# Patient Record
Sex: Female | Born: 1985 | Race: White | Hispanic: No | Marital: Married | Smoking: Former smoker
Health system: Southern US, Community
[De-identification: ages and names within clinical notes are randomized; demographics above are authoritative.]

## PROBLEM LIST (undated history)

## (undated) ENCOUNTER — Inpatient Hospital Stay: Payer: Self-pay

## (undated) DIAGNOSIS — F909 Attention-deficit hyperactivity disorder, unspecified type: Secondary | ICD-10-CM

## (undated) HISTORY — PX: NO PAST SURGERIES: SHX2092

---

## 2004-12-12 ENCOUNTER — Ambulatory Visit: Payer: Self-pay | Admitting: Pain Medicine

## 2004-12-14 ENCOUNTER — Ambulatory Visit: Payer: Self-pay | Admitting: Pain Medicine

## 2004-12-19 ENCOUNTER — Ambulatory Visit: Payer: Self-pay | Admitting: Pain Medicine

## 2004-12-26 ENCOUNTER — Ambulatory Visit: Payer: Self-pay | Admitting: Physician Assistant

## 2007-08-17 ENCOUNTER — Ambulatory Visit: Payer: Self-pay | Admitting: Pain Medicine

## 2007-08-25 ENCOUNTER — Ambulatory Visit: Payer: Self-pay | Admitting: Pain Medicine

## 2008-10-05 ENCOUNTER — Emergency Department: Payer: Self-pay | Admitting: Internal Medicine

## 2011-11-04 DIAGNOSIS — F909 Attention-deficit hyperactivity disorder, unspecified type: Secondary | ICD-10-CM | POA: Insufficient documentation

## 2011-11-04 DIAGNOSIS — F988 Other specified behavioral and emotional disorders with onset usually occurring in childhood and adolescence: Secondary | ICD-10-CM | POA: Insufficient documentation

## 2013-03-01 ENCOUNTER — Ambulatory Visit: Payer: Self-pay | Admitting: Pain Medicine

## 2013-03-02 ENCOUNTER — Ambulatory Visit: Payer: Self-pay | Admitting: Pain Medicine

## 2013-09-10 DIAGNOSIS — IMO0002 Reserved for concepts with insufficient information to code with codable children: Secondary | ICD-10-CM | POA: Insufficient documentation

## 2013-09-10 DIAGNOSIS — Z8619 Personal history of other infectious and parasitic diseases: Secondary | ICD-10-CM | POA: Insufficient documentation

## 2014-05-13 NOTE — L&D Delivery Note (Signed)
VAGINAL DELIVERY NOTE:  Date of Delivery: 03/02/2015 Primary OB: [facility]  Gestational Age/EDD: 3278w1d 02/22/2015, by Last Menstrual Period Antepartum complications: none Attending Physician: Annamarie MajorPaul Kerah Hardebeck, MD, FACOG Delivery Type: spontaneous vaginal delivery  Anesthesia: epidural Laceration: 1st degree Episiotomy: none Placenta: spontaneous Intrapartum complications: None Estimated Blood Loss: 300 mL GBS: Neg  Baby: Liveborn female, Apgars 3/7, weight 8#, 0 oz  Annamarie MajorPaul Eliab Closson, MD

## 2014-07-29 LAB — OB RESULTS CONSOLE PLATELET COUNT: Platelets: 254 10*3/uL

## 2014-07-29 LAB — OB RESULTS CONSOLE HEPATITIS B SURFACE ANTIGEN: HEP B S AG: NEGATIVE

## 2014-07-29 LAB — OB RESULTS CONSOLE HIV ANTIBODY (ROUTINE TESTING): HIV: NONREACTIVE

## 2014-07-29 LAB — OB RESULTS CONSOLE RUBELLA ANTIBODY, IGM: Rubella: IMMUNE

## 2014-07-29 LAB — OB RESULTS CONSOLE RPR: RPR: NONREACTIVE

## 2014-07-29 LAB — OB RESULTS CONSOLE ABO/RH: RH Type: POSITIVE

## 2014-07-29 LAB — OB RESULTS CONSOLE ANTIBODY SCREEN: Antibody Screen: NEGATIVE

## 2014-07-29 LAB — OB RESULTS CONSOLE VARICELLA ZOSTER ANTIBODY, IGG: Varicella: IMMUNE

## 2014-11-29 ENCOUNTER — Observation Stay: Payer: BLUE CROSS/BLUE SHIELD

## 2014-11-29 ENCOUNTER — Observation Stay
Admission: EM | Admit: 2014-11-29 | Discharge: 2014-11-29 | Disposition: A | Discharge status: Home / Self Care | Payer: BLUE CROSS/BLUE SHIELD | Attending: Obstetrics and Gynecology | Admitting: Obstetrics and Gynecology

## 2014-11-29 DIAGNOSIS — Z3490 Encounter for supervision of normal pregnancy, unspecified, unspecified trimester: Secondary | ICD-10-CM

## 2014-11-29 DIAGNOSIS — O26892 Other specified pregnancy related conditions, second trimester: Principal | ICD-10-CM | POA: Insufficient documentation

## 2014-11-29 DIAGNOSIS — N1330 Unspecified hydronephrosis: Secondary | ICD-10-CM | POA: Insufficient documentation

## 2014-11-29 DIAGNOSIS — N200 Calculus of kidney: Secondary | ICD-10-CM | POA: Diagnosis not present

## 2014-11-29 DIAGNOSIS — Z3A27 27 weeks gestation of pregnancy: Secondary | ICD-10-CM | POA: Insufficient documentation

## 2014-11-29 DIAGNOSIS — R109 Unspecified abdominal pain: Secondary | ICD-10-CM | POA: Diagnosis present

## 2014-11-29 DIAGNOSIS — R10A1 Flank pain, right side: Secondary | ICD-10-CM | POA: Diagnosis present

## 2014-11-29 LAB — BASIC METABOLIC PANEL
Anion gap: 7 (ref 5–15)
BUN: 7 mg/dL (ref 6–20)
CALCIUM: 8.6 mg/dL — AB (ref 8.9–10.3)
CHLORIDE: 107 mmol/L (ref 101–111)
CO2: 22 mmol/L (ref 22–32)
Creatinine, Ser: 0.5 mg/dL (ref 0.44–1.00)
GFR calc Af Amer: >60 mL/min (ref >60)
GFR calc non Af Amer: >60 mL/min (ref >60)
GLUCOSE: 79 mg/dL (ref 65–99)
Potassium: 3.9 mmol/L (ref 3.5–5.1)
Sodium: 136 mmol/L (ref 135–145)

## 2014-11-29 LAB — URINALYSIS COMPLETE WITH MICROSCOPIC (ARMC ONLY)
Bilirubin Urine: NEGATIVE
Glucose, UA: NEGATIVE mg/dL
Ketones, ur: NEGATIVE mg/dL
NITRITE: NEGATIVE
PH: 5 (ref 5.0–8.0)
PROTEIN: NEGATIVE mg/dL
Specific Gravity, Urine: 1.017 (ref 1.005–1.030)

## 2014-11-29 LAB — CBC WITH DIFFERENTIAL/PLATELET
BASOS ABS: 0.1 10*3/uL (ref 0–0.1)
Basophils Relative: 1 %
Eosinophils Absolute: 0.1 10*3/uL (ref 0–0.7)
Eosinophils Relative: 1 %
HCT: 35.3 % (ref 35.0–47.0)
Hemoglobin: 12 g/dL (ref 12.0–16.0)
Lymphocytes Relative: 10 %
Lymphs Abs: 1.3 10*3/uL (ref 1.0–3.6)
MCH: 32.6 pg (ref 26.0–34.0)
MCHC: 34.1 g/dL (ref 32.0–36.0)
MCV: 95.7 fL (ref 80.0–100.0)
Monocytes Absolute: 0.7 10*3/uL (ref 0.2–0.9)
Monocytes Relative: 6 %
NEUTROS PCT: 84 %
Neutro Abs: 10.9 10*3/uL — ABNORMAL HIGH (ref 1.4–6.5)
Platelets: 167 10*3/uL (ref 150–440)
RBC: 3.69 MIL/uL — ABNORMAL LOW (ref 3.80–5.20)
RDW: 12.3 % (ref 11.5–14.5)
WBC: 13 10*3/uL — ABNORMAL HIGH (ref 3.6–11.0)

## 2014-11-29 MED ORDER — ACETAMINOPHEN-CODEINE #3 300-30 MG PO TABS
ORAL_TABLET | ORAL | Status: AC
  Administered 2014-11-29: 1 via ORAL
  Filled 2014-11-29: qty 1

## 2014-11-29 MED ORDER — ACETAMINOPHEN-CODEINE #3 300-30 MG PO TABS
1.0000 | ORAL_TABLET | Freq: Once | ORAL | Status: AC
  Administered 2014-11-29: 1 via ORAL

## 2014-11-29 MED ORDER — ACETAMINOPHEN-CODEINE #3 300-30 MG PO TABS: 1.0000 | ORAL_TABLET | ORAL | Status: DC | PRN

## 2014-11-29 NOTE — Progress Notes (Signed)
Pt to US via w/c in stable condition

## 2014-11-29 NOTE — OB Triage Note (Signed)
Pt with c/o right flank and right side pain.  Has vomitted X2  +FM, no ROM or VB.  HX of UTI, believes this might be one as she feels like she needs to void but doesn't

## 2014-11-29 NOTE — Plan of Care (Signed)
Pt instructed on how to collect and strain urine to collect kidney stone. Collection container, strainer and cup in bathroom. Pt verbalizes understanding of process

## 2014-11-29 NOTE — Progress Notes (Signed)
Pt returned from US.  EFM reapplied.

## 2014-11-29 NOTE — H&P (Signed)
Obstetric History and Physical  Carrie CornwallDana L Brown is a 29 y.o. G1P0000 with Estimated Date of Delivery: 02/22/15 per LMP and 10 wk US who presents at 2537w6d  presenting for right sided flank pain starting at 0245 this am. Pt also reported nausea and vomiting when pain was severe. Patient states she has been having no contractions, no vaginal bleeding, intact membranes, with active fetal movement.    Prenatal Course Source of Care: [facility]  with onset of care at 10 weeks Pregnancy complications or risks: Patient Active Problem List   Diagnosis Date Noted  . Pregnancy 11/29/2014  . Acute right flank pain 11/29/2014   Prenatal labs and studies: ABO, Rh: O+  Antibody: Neg Rubella: Immune Varicella: Immune RPR:  Non-reactive HBsAg:  neg HIV: neg GC/CT: unknown GBS: unknown 1 hr Glucola: not yet done   Genetic screening: 1st trimester negative    Prenatal Transfer Tool   PMHx: ADHD (off meds in pregnancy)  History reviewed. No pertinent past surgical history.  OB History  Gravida Para Term Preterm AB SAB TAB Ectopic Multiple Living  1 0 0 0 0 0 0 0 0 0     # Outcome Date GA Lbr Len/2nd Weight Sex Delivery Anes PTL Lv              1 Gravida               History   Social History  . Marital Status: Single    Spouse Name: N/A  . Number of Children: N/A  . Years of Education: N/A   Social History Main Topics  . Smoking status: Former Games developermoker  . Smokeless tobacco: Never Used  . Alcohol Use: No  . Drug Use: No  . Sexual Activity: Yes   Other Topics Concern  . None   Social History Narrative  . None      Prescriptions prior to admission  Medication Sig Dispense Refill Last Dose  . Prenatal Vit-Fe Fumarate-FA (PRENATAL MULTIVITAMIN) TABS tablet Take 1 tablet by mouth daily at 12 noon.       No Known Allergies  Review of Systems: Negative except for what is mentioned in HPI.  Physical Exam: BP 117/79 mmHg  Pulse 101  Temp(Src) 98.1 F (36.7 C) (Oral)  Resp 16   Ht 5\' 6"  (1.676 m)  Wt 190 lb (86.183 kg)  BMI 30.68 kg/m2  LMP 05/19/2014 GENERAL: Well-developed, well-nourished female in no acute distress.  LUNGS: Clear to auscultation bilaterally.  HEART: Regular rate and rhythm. ABDOMEN: right upper quadrant pain with deep palpation EXTREMITIES: Nontender, no edema Cervical Exam: deferred FHT: + Contractions: none noted    Pertinent Labs/Studies:   Results for orders placed or performed during the hospital encounter of 11/29/14 (from the past 24 hour(s))  Urinalysis complete, with microscopic ([facility] only)     Status: Abnormal   Collection Time: 11/29/14  4:16 AM  Result Value Ref Range   Color, Urine YELLOW (A) YELLOW   APPearance HAZY (A) CLEAR   Glucose, UA NEGATIVE NEGATIVE mg/dL   Bilirubin Urine NEGATIVE NEGATIVE   Ketones, ur NEGATIVE NEGATIVE mg/dL   Specific Gravity, Urine 1.017 1.005 - 1.030   Hgb urine dipstick 2+ (A) NEGATIVE   pH 5.0 5.0 - 8.0   Protein, ur NEGATIVE NEGATIVE mg/dL   Nitrite NEGATIVE NEGATIVE   Leukocytes, UA TRACE (A) NEGATIVE   RBC / HPF TOO NUMEROUS TO COUNT 0 - 5 RBC/hpf   WBC, UA 0-5 0 - 5 WBC/hpf  Bacteria, UA FEW (A) NONE SEEN   Squamous Epithelial / LPF 0-5 (A) NONE SEEN   Mucous PRESENT    Renal US showing 5 mm non-obstructing stone at the upper pole of right kidney, mild right sided hydronephrosis. Left kidney normal.   Assessment : IUP at [redacted]w[redacted]d   Plan: CMP and Urine culture ordered  Tylenol #3 for pain prn Consider Urology consult prn Strain urine

## 2014-11-29 NOTE — Progress Notes (Signed)
Report given to T. Brothers, CNM.  EFM off at this time.

## 2014-11-30 LAB — URINE CULTURE
Culture: NO GROWTH
Special Requests: NORMAL

## 2014-12-07 NOTE — Progress Notes (Signed)
S: " I feel a whole lot better. I am hungry. OK if I eat."  O: BP 117/79 mmHg  Pulse 101  Temp(Src) 98.1 F (36.7 C) (Oral)  Resp 16  Ht  (1.676 m)  Wt 190 lb (86.183 kg)  BMI 30.68 kg/m2  LMP 05/18/2014  General: looks comfortable in NAD Consulted Dr Ronne Binning from [facility] and reviewed ultrasound findings and plan of management with him.  A/P: Patient may have passed a stone; Dr Ronne Binning doubts that the stone seen on ultrasound was causing the pain, which is now resolved. Will discharge the patient on Flomax 0.4 mgm daily #30 and Tylenol #3 #30 and follow up with him on 27 July at his office on [address]. Will have her continue to strain her urine and drink plenty of fluids RTN to [facility] as scheduled and prn.  Farrel Conners, CNM   Late note written 07 December 2014.

## 2014-12-16 ENCOUNTER — Encounter: Payer: Self-pay | Admitting: Urology

## 2014-12-16 ENCOUNTER — Ambulatory Visit (INDEPENDENT_AMBULATORY_CARE_PROVIDER_SITE_OTHER): Payer: BLUE CROSS/BLUE SHIELD | Admitting: Urology

## 2014-12-16 VITALS — BP 98/67 | HR 111 | Ht 66.0 in | Wt 193.5 lb

## 2014-12-16 DIAGNOSIS — N1330 Unspecified hydronephrosis: Secondary | ICD-10-CM | POA: Diagnosis not present

## 2014-12-16 DIAGNOSIS — N200 Calculus of kidney: Secondary | ICD-10-CM

## 2014-12-16 LAB — URINALYSIS, COMPLETE
Bilirubin, UA: NEGATIVE
Glucose, UA: NEGATIVE
KETONES UA: NEGATIVE
NITRITE UA: NEGATIVE
PH UA: 7 (ref 5.0–7.5)
RBC, UA: NEGATIVE
Specific Gravity, UA: 1.025 (ref 1.005–1.030)
Urobilinogen, Ur: 1 mg/dL (ref 0.2–1.0)

## 2014-12-16 LAB — MICROSCOPIC EXAMINATION
RBC, UA: NONE SEEN /hpf (ref 0–?)
Renal Epithel, UA: NONE SEEN /hpf

## 2014-12-16 NOTE — Progress Notes (Signed)
12/16/2014 5:27 PM   Carrie Brown [birth year] [id number]  Referring provider: No referring provider defined for this encounter.  Chief Complaint  Patient presents with  . Flank Pain    f/u ER kidney stones, x 2-3 weeks ago     HPI: 29 yo F with currently [redacted] weeks pregnant with right acute onset right flank pain.  She was seen at L&D triage dx with kidney stone ~3 weeks ago. Renal ultrasound on 11/27/14 showed a 5 mm stone in the right upper pole with minimal right hydronephrosis (physiologic versus stone).  She was was discharged after her pain resolved several hours later.    After discharge, she did feel like she had a possible UTI for a few days but this ultimately resolved too.  He does endorse drinking plenty of water to help improve her symptoms.  No associated fevers or chills.  No n/v.    She denies a personal history of stones.    She does endorse history of fairly fequent UTIs but not more than 1 annually.  She has had a urinary tract infection during this pregnancy. Today she denies any urinary tract infection symptoms.  Does have some urinary frequency and urgency related to the pregnancy but no dysuria.  PMH: History reviewed. No pertinent past medical history.  Surgical History: History reviewed. No pertinent past surgical history.  Home Medications:    Medication List       This list is accurate as of: 12/16/14  5:27 PM.  Always use your most recent med list.               acetaminophen-codeine 300-30 MG per tablet  Commonly known as:  TYLENOL #3  TK 1 TO 2 TS PO  Q 4 TO 6 H PRN P     prenatal multivitamin Tabs tablet  Take 1 tablet by mouth daily at 12 noon.     tamsulosin 0.4 MG Caps capsule  Commonly known as:  FLOMAX  TK ONE C PO  QD        Allergies:  Allergies  Allergen Reactions  . Lisdexamfetamine Dimesylate Anxiety    emotional    Family History: History reviewed. No pertinent family history.  Social History:  reports that she  has quit smoking. She has never used smokeless tobacco. She reports that she does not drink alcohol or use illicit drugs.  ROS: UROLOGY Frequent Urination?: No Hard to postpone urination?: No Burning/pain with urination?: Yes Get up at night to urinate?: No Leakage of urine?: No Urine stream starts and stops?: No Trouble starting stream?: No Do you have to strain to urinate?: No Blood in urine?: No Urinary tract infection?: Yes Sexually transmitted disease?: No Injury to kidneys or bladder?: No Painful intercourse?: No Weak stream?: No Currently pregnant?: No Vaginal bleeding?: No Last menstrual period?: n  Gastrointestinal Nausea?: No Vomiting?: No Indigestion/heartburn?: No Diarrhea?: No Constipation?: No  Constitutional Fever: No Night sweats?: No Weight loss?: No Fatigue?: No  Skin Skin rash/lesions?: No Itching?: No  Eyes Blurred vision?: No Double vision?: No  Ears/Nose/Throat Sore throat?: No Sinus problems?: No  Hematologic/Lymphatic Swollen glands?: No Easy bruising?: No  Cardiovascular Leg swelling?: Yes (related to pregancy) Chest pain?: No  Respiratory Cough?: No Shortness of breath?: No  Endocrine Excessive thirst?: No  Musculoskeletal Back pain?: Yes Joint pain?: No  Neurological Headaches?: Yes Dizziness?: No  Psychologic Depression?: No Anxiety?: No  Physical Exam: BP 98/67 mmHg  Pulse 111  Ht 5\' 6"  (  1.676 m)  Wt 193 lb 8 oz (87.771 kg)  BMI 31.25 kg/m2  LMP 05/18/2014  Constitutional:  Alert and oriented, No acute distress. HEENT: [place] AT, moist mucus membranes.  Trachea midline, no masses. Cardiovascular: No clubbing, cyanosis, or edema. Respiratory: Normal respiratory effort, no increased work of breathing. GI: Abdomen is soft, nontender, nondistended, no abdominal masses.  Gravid ureterus.  GU: No CVA tenderness.  Skin: No rashes, bruises or suspicious lesions. Lymph: No cervical or inguinal  adenopathy. Neurologic: Grossly intact, no focal deficits, moving all 4 extremities. Psychiatric: Normal mood and affect.  Laboratory Data: Lab Results  Component Value Date   WBC 13.0* 11/29/2014   HGB 12.0 11/29/2014   HCT 35.3 11/29/2014   MCV 95.7 11/29/2014   PLT 167 11/29/2014    Lab Results  Component Value Date   CREATININE 0.50 11/29/2014    Urinalysis Results for orders placed or performed in visit on 12/16/14  Microscopic Examination  Result Value Ref Range   WBC, UA 11-30 (A) 0 -  5 /hpf   RBC, UA None seen 0 -  2 /hpf   Epithelial Cells (non renal) >10 (A) 0 - 10 /hpf   Renal Epithel, UA None seen None seen /hpf   Mucus, UA Present (A) Not Estab.   Bacteria, UA Many (A) None seen/Few  Urinalysis, Complete  Result Value Ref Range   Specific Gravity, UA 1.025 1.005 - 1.030   pH, UA 7.0 5.0 - 7.5   Color, UA Yellow Yellow   Appearance Ur Clear Clear   Leukocytes, UA Trace (A) Negative   Protein, UA Trace (A) Negative/Trace   Glucose, UA Negative Negative   Ketones, UA Negative Negative   RBC, UA Negative Negative   Bilirubin, UA Negative Negative   Urobilinogen, Ur 1.0 0.2 - 1.0 mg/dL   Nitrite, UA Negative Negative   Microscopic Examination See below:     Pertinent Imaging:  CLINICAL DATA: Acute onset of right flank pain. Initial encounter.  EXAM: RENAL / URINARY TRACT ULTRASOUND COMPLETE  COMPARISON: None.  FINDINGS: Right Kidney:  Length: 12.3 cm. Echogenicity within normal limits. There is suggestion of minimal right-sided hydronephrosis. A suspected 5 mm stone is noted at the upper pole of the right kidney.  Left Kidney:  Length: 11.6 cm. Echogenicity within normal limits. No mass or hydronephrosis visualized.  Bladder:  Decompressed and not well assessed.  IMPRESSION: 1. [place]  CLINICAL DATA: Acute onset of right flank pain. Initial encounter.  EXAM: RENAL / URINARY TRACT ULTRASOUND COMPLETE  COMPARISON:  None.  FINDINGS: Right Kidney:  Length: 12.3 cm. Echogenicity within normal limits. There is suggestion of minimal right-sided hydronephrosis. A suspected 5 mm stone is noted at the upper pole of the right kidney.  Left Kidney:  Length: 11.6 cm. Echogenicity within normal limits. No mass or hydronephrosis visualized.  Bladder:  Decompressed and not well assessed.  IMPRESSION: 1. Suggestion of minimal right-sided hydronephrosis, likely reflecting hydronephrosis of pregnancy. 2. Suspect 5 mm nonobstructing stone at the upper pole of the right kidney.   Electronically Signed  By: Roanna Raider M.D.  On: 11/29/2014 05:58    stion of minimal right-sided hydronephrosis, likely reflecting hydronephrosis of pregnancy. 2. Suspect 5 mm nonobstructing stone at the upper pole of the right kidney.   Electronically Signed  By: Roanna Raider M.D.  On: 11/29/2014 05:58     Assessment & Plan:  29 yo F currently [redacted] weeks pregnant with isolated episode of flank pain, 5 mm right  nonobstructing stone. Currently asymptomatic. Recommended no further intervention at this time given her stage of pregnancy, however, do recommend that she follow up if she develops any further episodes of flank pain, urinary tract symptoms, or any other concerning signs or symptoms.  Recommend follow-up after delivery of her child with a KUB to assess her overall stone burden. We discussed that depending on the size and locations of the stone, they may or may not need treatment in the future to prevent future episodes of renal colic. Patient is agreeable with this plan.  1. Renal stones As above - Urinalysis, Complete - DG Abd 1 View; Future  2. Hydronephrosis, right [place] physiologic upon review related to pregnancy.   Return in about 4 months (around 04/17/2015) for f/u KUB.  Vanna Scotland, MD  [facility] [address], [address] [place], [place]  [phone]

## 2015-03-01 ENCOUNTER — Encounter: Payer: Self-pay | Admitting: *Deleted

## 2015-03-01 ENCOUNTER — Inpatient Hospital Stay
Admission: EM | Admit: 2015-03-01 | Discharge: 2015-03-04 | DRG: 775 | Disposition: A | Discharge status: Home / Self Care | Payer: BLUE CROSS/BLUE SHIELD | Attending: Obstetrics & Gynecology | Admitting: Obstetrics & Gynecology

## 2015-03-01 DIAGNOSIS — Z79899 Other long term (current) drug therapy: Secondary | ICD-10-CM

## 2015-03-01 DIAGNOSIS — Z3A41 41 weeks gestation of pregnancy: Secondary | ICD-10-CM

## 2015-03-01 DIAGNOSIS — Z888 Allergy status to other drugs, medicaments and biological substances status: Secondary | ICD-10-CM

## 2015-03-01 DIAGNOSIS — O480 Post-term pregnancy: Principal | ICD-10-CM | POA: Diagnosis present

## 2015-03-01 HISTORY — DX: Attention-deficit hyperactivity disorder, unspecified type: F90.9

## 2015-03-01 LAB — CBC
HEMATOCRIT: 35.6 % (ref 35.0–47.0)
HEMOGLOBIN: 11.9 g/dL — AB (ref 12.0–16.0)
MCH: 30.4 pg (ref 26.0–34.0)
MCHC: 33.4 g/dL (ref 32.0–36.0)
MCV: 91.1 fL (ref 80.0–100.0)
Platelets: 175 10*3/uL (ref 150–440)
RBC: 3.9 MIL/uL (ref 3.80–5.20)
RDW: 13.7 % (ref 11.5–14.5)
WBC: 12.2 10*3/uL — AB (ref 3.6–11.0)

## 2015-03-01 MED ORDER — OXYTOCIN 40 UNITS IN LACTATED RINGERS INFUSION - SIMPLE MED
1.0000 mL/h | INTRAVENOUS | Status: DC
  Administered 2015-03-01: 1 m[IU]/min via INTRAVENOUS

## 2015-03-01 MED ORDER — TERBUTALINE SULFATE 1 MG/ML IJ SOLN: 0.2500 mg | Freq: Once | INTRAMUSCULAR | Status: DC | PRN

## 2015-03-01 MED ORDER — LACTATED RINGERS IV SOLN
INTRAVENOUS | Status: DC
  Administered 2015-03-01 – 2015-03-02 (×4): via INTRAVENOUS

## 2015-03-01 MED ORDER — OXYTOCIN 40 UNITS IN LACTATED RINGERS INFUSION - SIMPLE MED: 1.0000 mL/h | INTRAVENOUS | Status: DC

## 2015-03-01 MED ORDER — LIDOCAINE HCL (PF) 1 % IJ SOLN
30.0000 mL | INTRAMUSCULAR | Status: DC | PRN
  Filled 2015-03-01: qty 30

## 2015-03-01 MED ORDER — CITRIC ACID-SODIUM CITRATE 334-500 MG/5ML PO SOLN: 30.0000 mL | ORAL | Status: DC | PRN

## 2015-03-01 MED ORDER — OXYTOCIN 40 UNITS IN LACTATED RINGERS INFUSION - SIMPLE MED
INTRAVENOUS | Status: AC
  Administered 2015-03-01: 1 m[IU]/min via INTRAVENOUS
  Filled 2015-03-01: qty 1000

## 2015-03-01 MED ORDER — OXYTOCIN 40 UNITS IN LACTATED RINGERS INFUSION - SIMPLE MED
62.5000 mL/h | INTRAVENOUS | Status: DC
Start: 2015-03-01 — End: 2015-03-02

## 2015-03-01 MED ORDER — ONDANSETRON HCL 4 MG/2ML IJ SOLN: 4.0000 mg | Freq: Four times a day (QID) | INTRAMUSCULAR | Status: DC | PRN

## 2015-03-01 MED ORDER — LACTATED RINGERS IV SOLN: 500.0000 mL | INTRAVENOUS | Status: DC | PRN

## 2015-03-01 MED ORDER — OXYTOCIN 10 UNIT/ML IJ SOLN: 10.0000 [IU] | Freq: Once | INTRAMUSCULAR | Status: DC

## 2015-03-01 MED ORDER — OXYTOCIN 40 UNITS IN LACTATED RINGERS INFUSION - SIMPLE MED: 1.0000 m[IU]/min | INTRAVENOUS | Status: DC

## 2015-03-01 MED ORDER — OXYTOCIN BOLUS FROM INFUSION
500.0000 mL | INTRAVENOUS | Status: DC
Start: 2015-03-01 — End: 2015-03-02
  Administered 2015-03-02: 500 mL via INTRAVENOUS

## 2015-03-01 NOTE — H&P (Signed)
OB History & Physical   History of Present Illness:  Chief Complaint: presents for induction of labor  HPI:  Orvilla CornwallDana L Brucks is a 29 y.o. G1P0000 female at 4522w0d dated by LMP consistent with a 11 week ultrasound.  Her pregnancy has been uncomplicated.    She reports occasional contractions.   She denies leakage of fluid.   She denies vaginal bleeding.   She reports fetal movement.    Maternal Medical History:   Past Medical History  Diagnosis Date  . ADHD (attention deficit hyperactivity disorder)     Past Surgical History  Procedure Laterality Date  . No past surgeries      Allergies  Allergen Reactions  . Lisdexamfetamine Dimesylate Anxiety    emotional    Prior to Admission medications   Medication Sig Start Date End Date Taking? Authorizing Provider  Prenatal Vit-Fe Fumarate-FA (PRENATAL MULTIVITAMIN) TABS tablet Take 1 tablet by mouth daily at 12 noon.   Yes Historical Provider, MD   OB History  Gravida Para Term Preterm AB SAB TAB Ectopic Multiple Living  1 0 0 0 0 0 0 0 0 0     # Outcome Date GA Lbr Len/2nd Weight Sex Delivery Anes PTL Lv  1 Current               Prenatal care site: [facility]  Social History: She  reports that she has never smoked. She has never used smokeless tobacco. She reports that she does not drink alcohol or use illicit drugs.  Family History: family history is not on file.   Review of Systems: Negative x 10 systems reviewed except as noted in the HPI.    Physical Exam:  Vital Signs:  General: no acute distress.  HEENT: normocephalic, atraumatic Heart: regular rate & rhythm.  No murmurs/rubs/gallops Lungs: clear to auscultation bilaterally Abdomen: soft, gravid, non-tender;  EFW: 8 pounds Pelvic:   Cervix: Dilation: 3.5 / Effacement (%): 70, 80 (75) / Station: -2 , per RN Extremities: non-tender, symmetric, no edema bilaterally.  DTRs: 2+  Neurologic: Alert & oriented x 3.    Pertinent Results:  Prenatal Labs: Blood  type/Rh O positive  Antibody screen negative  Rubella Immune  RPR NR  HBsAg negative  HIV negative  GC negative  Chlamydia negative  Genetic screening First trimester screen negative  1 hour GTT 103  3 hour GTT n/a  GBS negative on 01/27/15   Baseline FHR: 130 beats/min   Variability: moderate   Accelerations: present   Decelerations: absent Contractions: present frequency: 2 q 10 min Overall assessment: category 1  Assessment:  Orvilla CornwallDana L Haughey is a 29 y.o. 641P0000 female at 4122w0d with an uncomplicated pregnancy who presents for induction of labor for post-dates.   Plan:  1. Admit to Labor & Delivery  2. CBC, T&S, Clrs, IVF 3. GBS negative.   4. Fetwal well-being: reassuring. 5. For induction will start pitocin given cervical dilation. Patient agrees to plan.   Conard NovakJackson, Ralphine Hinks D, MD 03/01/2015 10:56 PM

## 2015-03-02 ENCOUNTER — Inpatient Hospital Stay: Payer: BLUE CROSS/BLUE SHIELD | Admitting: Registered Nurse

## 2015-03-02 LAB — TYPE AND SCREEN
ABO/RH(D): O POS
ANTIBODY SCREEN: NEGATIVE

## 2015-03-02 LAB — RAPID HIV SCREEN (HIV 1/2 AB+AG)
HIV 1/2 Antibodies: NONREACTIVE
HIV-1 P24 Antigen - HIV24: NONREACTIVE

## 2015-03-02 LAB — ABO/RH: ABO/RH(D): O POS

## 2015-03-02 MED ORDER — DIPHENHYDRAMINE HCL 25 MG PO CAPS: 25.0000 mg | ORAL_CAPSULE | Freq: Four times a day (QID) | ORAL | Status: DC | PRN

## 2015-03-02 MED ORDER — LIDOCAINE HCL (PF) 1 % IJ SOLN
INTRAMUSCULAR | Status: AC
  Filled 2015-03-02: qty 30

## 2015-03-02 MED ORDER — FENTANYL 2.5 MCG/ML W/ROPIVACAINE 0.2% IN NS 100 ML EPIDURAL INFUSION (ARMC-ANES)
EPIDURAL | Status: AC
  Administered 2015-03-02: 10 mL/h via EPIDURAL
  Administered 2015-03-02: 8 mL/h
  Filled 2015-03-02: qty 100

## 2015-03-02 MED ORDER — OXYTOCIN 40 UNITS IN LACTATED RINGERS INFUSION - SIMPLE MED: 62.5000 mL/h | INTRAVENOUS | Status: DC | PRN

## 2015-03-02 MED ORDER — HYDROMORPHONE HCL 1 MG/ML IJ SOLN
1.0000 mg | INTRAMUSCULAR | Status: AC
  Administered 2015-03-02: 1 mg via INTRAVENOUS
  Filled 2015-03-02: qty 1

## 2015-03-02 MED ORDER — LIDOCAINE-EPINEPHRINE (PF) 1.5 %-1:200000 IJ SOLN
INTRAMUSCULAR | Status: DC | PRN
  Administered 2015-03-02: 3 mL via EPIDURAL

## 2015-03-02 MED ORDER — ACETAMINOPHEN 325 MG PO TABS: 650.0000 mg | ORAL_TABLET | ORAL | Status: DC | PRN

## 2015-03-02 MED ORDER — BENZOCAINE-MENTHOL 20-0.5 % EX AERO
1.0000 "application " | INHALATION_SPRAY | CUTANEOUS | Status: DC | PRN
  Administered 2015-03-02: 1 via TOPICAL
  Filled 2015-03-02: qty 56

## 2015-03-02 MED ORDER — IBUPROFEN 600 MG PO TABS
600.0000 mg | ORAL_TABLET | Freq: Four times a day (QID) | ORAL | Status: DC
  Administered 2015-03-02 – 2015-03-03 (×3): 600 mg via ORAL
  Filled 2015-03-02 (×5): qty 1

## 2015-03-02 MED ORDER — SODIUM CHLORIDE 0.9 % IV SOLN
250.0000 mL | INTRAVENOUS | Status: DC | PRN
Start: 2015-03-02 — End: 2015-03-04

## 2015-03-02 MED ORDER — LANOLIN HYDROUS EX OINT: TOPICAL_OINTMENT | CUTANEOUS | Status: DC | PRN

## 2015-03-02 MED ORDER — MISOPROSTOL 200 MCG PO TABS
ORAL_TABLET | ORAL | Status: AC
  Filled 2015-03-02: qty 4

## 2015-03-02 MED ORDER — FENTANYL CITRATE (PF) 100 MCG/2ML IJ SOLN
INTRAMUSCULAR | Status: AC
  Administered 2015-03-02: 50 ug via INTRAVENOUS
  Filled 2015-03-02: qty 2

## 2015-03-02 MED ORDER — OXYCODONE-ACETAMINOPHEN 5-325 MG PO TABS
2.0000 | ORAL_TABLET | ORAL | Status: DC | PRN
Start: 2015-03-02 — End: 2015-03-04

## 2015-03-02 MED ORDER — OXYTOCIN 10 UNIT/ML IJ SOLN
INTRAMUSCULAR | Status: DC
Start: 2015-03-02 — End: 2015-03-02
  Filled 2015-03-02: qty 2

## 2015-03-02 MED ORDER — SIMETHICONE 80 MG PO CHEW: 80.0000 mg | CHEWABLE_TABLET | ORAL | Status: DC | PRN

## 2015-03-02 MED ORDER — ONDANSETRON HCL 4 MG PO TABS: 4.0000 mg | ORAL_TABLET | ORAL | Status: DC | PRN

## 2015-03-02 MED ORDER — WITCH HAZEL-GLYCERIN EX PADS: 1.0000 "application " | MEDICATED_PAD | CUTANEOUS | Status: DC | PRN

## 2015-03-02 MED ORDER — OXYCODONE-ACETAMINOPHEN 5-325 MG PO TABS
1.0000 | ORAL_TABLET | ORAL | Status: DC | PRN
  Administered 2015-03-03: 1 via ORAL
  Filled 2015-03-02: qty 1

## 2015-03-02 MED ORDER — ZOLPIDEM TARTRATE 5 MG PO TABS: 5.0000 mg | ORAL_TABLET | Freq: Every evening | ORAL | Status: DC | PRN

## 2015-03-02 MED ORDER — FENTANYL CITRATE (PF) 100 MCG/2ML IJ SOLN
50.0000 ug | Freq: Once | INTRAMUSCULAR | Status: AC
  Administered 2015-03-02: 50 ug via INTRAVENOUS

## 2015-03-02 MED ORDER — SENNOSIDES-DOCUSATE SODIUM 8.6-50 MG PO TABS
2.0000 | ORAL_TABLET | ORAL | Status: DC
  Administered 2015-03-03 (×2): 2 via ORAL
  Filled 2015-03-02 (×3): qty 2

## 2015-03-02 MED ORDER — AMMONIA AROMATIC IN INHA
RESPIRATORY_TRACT | Status: AC
  Filled 2015-03-02: qty 10

## 2015-03-02 MED ORDER — BUPIVACAINE HCL (PF) 0.25 % IJ SOLN
INTRAMUSCULAR | Status: DC | PRN
  Administered 2015-03-02 (×2): 4 mL via EPIDURAL

## 2015-03-02 MED ORDER — ONDANSETRON HCL 4 MG/2ML IJ SOLN: 4.0000 mg | INTRAMUSCULAR | Status: DC | PRN

## 2015-03-02 MED ORDER — SODIUM CHLORIDE 0.9 % IJ SOLN: 3.0000 mL | Freq: Two times a day (BID) | INTRAMUSCULAR | Status: DC

## 2015-03-02 MED ORDER — SODIUM CHLORIDE 0.9 % IJ SOLN: 3.0000 mL | INTRAMUSCULAR | Status: DC | PRN

## 2015-03-02 MED ORDER — DIBUCAINE 1 % RE OINT: 1.0000 "application " | TOPICAL_OINTMENT | RECTAL | Status: DC | PRN

## 2015-03-02 NOTE — Discharge Summary (Signed)
Obstetrical Discharge Summary  Date of Admission: 03/01/2015 Date of Discharge: 03/04/2015 Discharge Diagnosis: Term Pregnancy-delivered Primary OB:  [facility]   Gestational Age at Delivery: 636w1d  Antepartum complications: none Date of Delivery: 03/02/15  Delivered By: Tiburcio PeaHarris Delivery Type: spontaneous vaginal delivery Intrapartum complications/course: None Anesthesia: epidural Placenta: spontaneous Laceration: 1st degree Episiotomy: none Live born Female  Birth Weight: 8 lb (3629 g) APGAR: 3, 7   Post partum course: Since the delivery, patient has tolerate activity, diet, and daily functions without difficulty or complication.  Min lochia.  No breast concerns at this time.  No signs of depression currently.    Disposition: home with infant Rh Immune globulin given: not applicable Rubella vaccine given: not applicable Varicella vaccine given: not applicable Tdap vaccine given in AP or PP setting: yes Flu vaccine given in AP or PP setting: yes Contraception: to be determined at post partum visit  Prenatal Labs: O POS / Rubella Immune / Varicella Immune/  RPR negative / HIV negative / HepBsAg negative / Tdap UTD: Yes/pap neg 2016/ Breast   / Follow up: [facility]   Plan:  Carrie Brown was discharged to home in good condition. [facility]  Discharge Medications:   Medication List    STOP taking these medications        acetaminophen-codeine 300-30 MG tablet  Commonly known as:  TYLENOL #3      TAKE these medications        diphenhydrAMINE 25 MG tablet  Commonly known as:  BENADRYL  Take 25 mg by mouth every 6 (six) hours as needed for sleep.     docusate sodium 100 MG capsule  Commonly known as:  COLACE  Take 1 capsule (100 mg total) by mouth 2 (two) times daily.     ibuprofen 600 MG tablet  Commonly known as:  ADVIL,MOTRIN  Take 1 tablet (600 mg total) by mouth every 6 (six) hours as needed.      oxyCODONE-acetaminophen 5-325 MG tablet  Commonly known as:  PERCOCET/ROXICET  Take 1 tablet by mouth every 6 (six) hours as needed (for pain scale 4-7).     prenatal multivitamin Tabs tablet  Take 1 tablet by mouth daily at 12 noon.     tamsulosin 0.4 MG Caps capsule  Commonly known as:  FLOMAX  TK ONE C PO  QD        Follow-up arrangements:  4wk PP visit   Future Appointments Date Time Provider Department Center  04/17/2015 9:00 AM Harle BattiestShannon A McGowan, PA-C [facility] None   Cornelia Copaharlie Oryn Casanova, Jr MD [facility]  Pager: [phone]

## 2015-03-02 NOTE — Progress Notes (Addendum)
L&D Progress Note   S: Feeling much better after epidural.   O: BP 113/57 mmHg  Pulse 95  Temp(Src) 97.6 F (36.4 C) (Oral)  Resp 16  Ht 5\' 6"  (1.676 m)  Wt 190 lb (86.183 kg)  BMI 30.68 kg/m2  SpO2 98%  LMP 05/18/2014  Undergoing Pitocin induction for postdates. Was on 19 miu/min Pitocin when SROM occurred at 0545 for clear fluid. Pitocin discontinued at that time until epidural could be obtained.  FHR 120 with mod variability and accels with scalp stimulation.  Contractions: 3 in 10 min Cervix: 5/90%/-1/LOA  A: IUP at 41.1 weeks with IOL underway Reassuring FHR  P: IUPC inserted-inadequate contractions Restart Pitocin at 2 miu/min and increase by 261miu/min Titrate to 200 mvu  Carrie Brown, CNM

## 2015-03-02 NOTE — Lactation Note (Signed)
Lactation Consultation Note  Patient Name: Carrie Brown BJYNW'GToday's Date: 03/02/2015     Maternal Data  Mother only wants to pump. Education on how to pump including cleaning the parts of the pump and storing the milk given. Mother will feed formula now and pump at a later feeding. I have expained that the earlier she pumps the better her supply will be. Feeding  Pumped milk and formula                       Lactation Tools Discussed/Used   Breast Pump  Consult Status  Ongoing    Carrie Brown 03/02/2015, 5:41 PM

## 2015-03-02 NOTE — Anesthesia Procedure Notes (Signed)
Epidural Patient location during procedure: OB Start time: 03/02/2015 8:00 AM End time: 03/02/2015 8:21 AM  Staffing Resident/CRNA: Stormy FabianURTIS, Kinslea Frances Performed by: resident/CRNA   Preanesthetic Checklist Completed: patient identified, site marked, surgical consent, pre-op evaluation, timeout performed, IV checked, risks and benefits discussed and monitors and equipment checked  Epidural Patient position: sitting Prep: Betadine Patient monitoring: heart rate, continuous pulse ox and blood pressure Approach: midline Location: L4-L5 Injection technique: LOR air  Needle:  Needle type: Tuohy  Needle gauge: 18 G Needle length: 9 cm and 9 Needle insertion depth: 6 cm Catheter type: closed end flexible Catheter size: 20 Guage Catheter at skin depth: 10 cm Test dose: negative and 1.5% lidocaine with Epi 1:200 K  Assessment Sensory level: T10 Events: blood not aspirated, injection not painful, no injection resistance, negative IV test and no paresthesia  Additional Notes Pt's history reviewed and consent obtained as per OB consent Patient tolerated the insertion well without complications. Negative SATD, negative IVTD All VSS were obtained and monitored through OBIX and nursing protocols followed.Reason for block:procedure for pain

## 2015-03-02 NOTE — Progress Notes (Signed)
L&D Progress Note  S: Feeling min pain.   O: BP 96/59 mmHg  Pulse 90  Temp(Src) 98.1 F (36.7 C) (Oral)  Resp 16  Ht 5\' 6"  (1.676 m)  Wt 190 lb (86.183 kg)  BMI 30.68 kg/m2  SpO2 98%  LMP 05/18/2014  Undergoing Pitocin induction for postdates. Current 12  miu/min Pitocin FHR 150 with mod variability and accels and occas early decel.  Contractions: q2-4 min; mVu 110-175. Cervix: 9.5/100%/-2/LOA  A: IUP at 41.1 weeks with IOL underway Reassuring FHR  P: Pitocin at 12 miu/min Second stage soon Epidural, adjust to facilitate pushing  Letitia LibraHARRIS,Wanda Rideout PAUL, MD

## 2015-03-02 NOTE — Anesthesia Preprocedure Evaluation (Signed)
Anesthesia Evaluation  Patient identified by MRN, date of birth, ID band  Reviewed: Allergy & Precautions, H&P , NPO status , Patient's Chart, lab work & pertinent test results  History of Anesthesia Complications Negative for: history of anesthetic complications  Airway Mallampati: II  TM Distance: >3 FB Neck ROM: full    Dental no notable dental hx.    Pulmonary neg pulmonary ROS,    Pulmonary exam normal        Cardiovascular negative cardio ROS Normal cardiovascular exam     Neuro/Psych PSYCHIATRIC DISORDERS ADDnegative neurological ROS     GI/Hepatic negative GI ROS, Neg liver ROS,   Endo/Other  negative endocrine ROS  Renal/GU negative Renal ROS  negative genitourinary   Musculoskeletal   Abdominal   Peds  Hematology negative hematology ROS (+)   Anesthesia Other Findings   Reproductive/Obstetrics (+) Pregnancy                             Anesthesia Physical Anesthesia Plan  ASA: II  Anesthesia Plan: Epidural   Post-op Pain Management:    Induction:   Airway Management Planned:   Additional Equipment:   Intra-op Plan:   Post-operative Plan:   Informed Consent: I have reviewed the patients History and Physical, chart, labs and discussed the procedure including the risks, benefits and alternatives for the proposed anesthesia with the patient or authorized representative who has indicated his/her understanding and acceptance.     Plan Discussed with: Anesthesiologist  Anesthesia Plan Comments:         Anesthesia Quick Evaluation

## 2015-03-03 LAB — CBC
HCT: 32.5 % — ABNORMAL LOW (ref 35.0–47.0)
Hemoglobin: 11.3 g/dL — ABNORMAL LOW (ref 12.0–16.0)
MCH: 31.7 pg (ref 26.0–34.0)
MCHC: 34.6 g/dL (ref 32.0–36.0)
MCV: 91.7 fL (ref 80.0–100.0)
PLATELETS: 126 10*3/uL — AB (ref 150–440)
RBC: 3.55 MIL/uL — ABNORMAL LOW (ref 3.80–5.20)
RDW: 13.9 % (ref 11.5–14.5)
WBC: 15.5 10*3/uL — ABNORMAL HIGH (ref 3.6–11.0)

## 2015-03-03 LAB — RPR: RPR Ser Ql: NONREACTIVE

## 2015-03-03 NOTE — Progress Notes (Signed)
Post Partum Day 1 Subjective: Doing well, no complaints.  Tolerating regular diet, pain with PO meds, voiding and ambulating without difficulty.  No CP SOB F/C N/V or leg pain   Objective: BP 116/71 mmHg  Pulse 105  Temp(Src) 97.9 F (36.6 C) (Oral)  Resp 18  SpO2 98%   Physical Exam:  General: NAD CV: RRR Pulm: nl effort, CTABL Lochia: moderate Uterine Fundus: fundus firm and below umbilicus DVT Evaluation: no cords, ttp LEs    Recent Labs  03/01/15 2258 03/03/15 0615  HGB 11.9* 11.3*  HCT 35.6 32.5*  WBC 12.2* 15.5*  PLT 175 126*    Assessment/Plan: 29 y.o. G1P1001 postpartum day # 1  1. Doing well, has been OOB and ambulating well.  Meeting goals.  Possible d/c tonight if baby is able to be, but anticipate d/c tomorrow. 2. Continue routine post partum cares.  ----- Ranae Plumberhelsea Ward, MD Attending Obstetrician and Gynecologist [facility]

## 2015-03-03 NOTE — Anesthesia Postprocedure Evaluation (Signed)
  Anesthesia Post-op Note  Patient: Carrie Brown  Procedure(s) Performed: CLE  Anesthesia type:Epidural  Patient location: 350  Post pain: Pain level controlled  Post assessment: Post-op Vital signs reviewed, Patient's Cardiovascular Status Stable, Respiratory Function Stable, Patent Airway and No signs of Nausea or vomiting  Post vital signs: Reviewed and stable  Last Vitals:  Filed Vitals:   03/03/15 0455  BP: 91/52  Pulse: 89  Temp: 36.6 C  Resp: 20    Level of consciousness: awake, alert  and patient cooperative  Complications: No apparent anesthesia complications

## 2015-03-04 MED ORDER — DOCUSATE SODIUM 100 MG PO CAPS: 100.0000 mg | ORAL_CAPSULE | Freq: Two times a day (BID) | ORAL | Status: DC

## 2015-03-04 MED ORDER — IBUPROFEN 600 MG PO TABS: 600.0000 mg | ORAL_TABLET | Freq: Four times a day (QID) | ORAL | Status: DC | PRN

## 2015-03-04 MED ORDER — OXYCODONE-ACETAMINOPHEN 5-325 MG PO TABS: 1.0000 | ORAL_TABLET | Freq: Four times a day (QID) | ORAL | Status: DC | PRN

## 2015-03-04 MED ORDER — INFLUENZA VAC SPLIT QUAD 0.5 ML IM SUSY: 0.5000 mL | PREFILLED_SYRINGE | INTRAMUSCULAR | Status: DC

## 2015-03-04 MED ORDER — INFLUENZA VAC SPLIT QUAD 0.5 ML IM SUSY
0.5000 mL | PREFILLED_SYRINGE | Freq: Once | INTRAMUSCULAR | Status: AC
  Administered 2015-03-04: 0.5 mL via INTRAMUSCULAR
  Filled 2015-03-04: qty 0.5

## 2015-03-04 NOTE — Progress Notes (Signed)
Discharge instructions complete. Prescriptions given. Pt. discharged home at 1815.

## 2015-03-04 NOTE — Discharge Instructions (Signed)
Vaginal Delivery, Care After Refer to this sheet in the next few weeks. These discharge instructions provide you with information on caring for yourself after delivery. Your caregiver may also give you specific instructions. Your treatment has been planned according to the most current medical practices available, but problems sometimes occur. Call your caregiver if you have any problems or questions after you go home. HOME CARE INSTRUCTIONS 1. Take over-the-counter or prescription medicines only as directed by your caregiver or pharmacist. 2. Do not drink alcohol, especially if you are breastfeeding or taking medicine to relieve pain. 3. Do not smoke tobacco. 4. Continue to use good perineal care. Good perineal care includes: 1. Wiping your perineum from back to front 2. Keeping your perineum clean. 3. You can do sitz baths twice a day, to help keep this area clean 5. Do not use tampons, douche or have sex until your caregiver says it is okay. 6. Shower only and avoid sitting in submerged water, aside from sitz baths 7. Wear a well-fitting bra that provides breast support. 8. Eat healthy foods. 9. Drink enough fluids to keep your urine clear or pale yellow. 10. Eat high-fiber foods such as whole grain cereals and breads, brown rice, beans, and fresh fruits and vegetables every day. These foods may help prevent or relieve constipation. 11. Avoid constipation with high fiber foods or medications, such as miralax or metamucil 12. Follow your caregiver's recommendations regarding resumption of activities such as climbing stairs, driving, lifting, exercising, or traveling. 13. Talk to your caregiver about resuming sexual activities. Resumption of sexual activities is dependent upon your risk of infection, your rate of healing, and your comfort and desire to resume sexual activity. 14. Try to have someone help you with your household activities and your newborn for at least a few days after you leave  the hospital. 15. Rest as much as possible. Try to rest or take a nap when your newborn is sleeping. 16. Increase your activities gradually. 17. Keep all of your scheduled postpartum appointments. It is very important to keep your scheduled follow-up appointments. At these appointments, your caregiver will be checking to make sure that you are healing physically and emotionally. SEEK MEDICAL CARE IF:   You are passing large clots from your vagina. Save any clots to show your caregiver.  You have a foul smelling discharge from your vagina.  You have trouble urinating.  You are urinating frequently.  You have pain when you urinate.  You have a change in your bowel movements.  You have increasing redness, pain, or swelling near your vaginal incision (episiotomy) or vaginal tear.  You have pus draining from your episiotomy or vaginal tear.  Your episiotomy or vaginal tear is separating.  You have painful, hard, or reddened breasts.  You have a severe headache.  You have blurred vision or see spots.  You feel sad or depressed.  You have thoughts of hurting yourself or your newborn.  You have questions about your care, the care of your newborn, or medicines.  You are dizzy or light-headed.  You have a rash.  You have nausea or vomiting.  You were breastfeeding and have not had a menstrual period within 12 weeks after you stopped breastfeeding.  You are not breastfeeding and have not had a menstrual period by the 12th week after delivery.  You have a fever. SEEK IMMEDIATE MEDICAL CARE IF:   You have persistent pain.  You have chest pain.  You have shortness of breath.    You faint.  You have leg pain.  You have stomach pain.  Your vaginal bleeding saturates two or more sanitary pads in 1 hour. MAKE SURE YOU:   Understand these instructions.  Will watch your condition.  Will get help right away if you are not doing well or get worse. Document Released:  04/26/2000 Document Revised: 09/13/2013 Document Reviewed: 12/25/2011 ExitCare Patient Information 2015 [employer]. This information is not intended to replace advice given to you by your health care provider. Make sure you discuss any questions you have with your health care provider.  Sitz Bath A sitz bath is a warm water bath taken in the sitting position. The water covers only the hips and butt (buttocks). We recommend using one that fits in the toilet, to help with ease of use and cleanliness. It may be used for either healing or cleaning purposes. Sitz baths are also used to relieve pain, itching, or muscle tightening (spasms). The water may contain medicine. Moist heat will help you heal and relax.  HOME CARE  Take 3 to 4 sitz baths a day. 18. Fill the bathtub half-full with warm water. 19. Sit in the water and open the drain a little. 20. Turn on the warm water to keep the tub half-full. Keep the water running constantly. 21. Soak in the water for 15 to 20 minutes. 22. After the sitz bath, pat the affected area dry. GET HELP RIGHT AWAY IF: You get worse instead of better. Stop the sitz baths if you get worse. MAKE SURE YOU:  Understand these instructions.  Will watch your condition.  Will get help right away if you are not doing well or get worse. Document Released: 06/06/2004 Document Revised: 01/22/2012 Document Reviewed: 08/27/2010 ExitCare Patient Information 2015 [employer]. This information is not intended to replace advice given to you by your health care provider. Make sure you discuss any questions you have with your health care provider.    

## 2015-03-04 NOTE — Progress Notes (Signed)
Daily Post Partum Note  Carrie Brown is a 29 y.o. G1P1001 PPD#2 s/p  SVD/1st  @ [id number].  Pregnancy c/b nothing  24hr/overnight events:  none  Subjective:  Pt doing well  Objective:     Current Vital Signs 24h Vital Sign Ranges  T 98 F (36.7 C) Temp  Avg: 97.9 F (36.6 C)  Min: 97.4 F (36.3 C)  Max: 98.2 F (36.8 C)  BP 100/66 mmHg BP  Min: 100/66  Max: 104/62  HR 98 Pulse  Avg: 103.3  Min: 94  Max: 118  RR 18 Resp  Avg: 18.7  Min: 18  Max: 20  SaO2 99 % Not Delivered SpO2  Avg: 99 %  Min: 98 %  Max: 100 %       24 Hour I/O Current Shift I/O  Time Ins Outs 10/21 0701 - 10/22 0700 In: -  Out: 900 [Urine:900]      General: NAD Abdomen: FF below the umbilicus, nttp Perineum: deferred Skin:  Warm and dry.  Cardiovascular:Regular rate and rhythm. Respiratory:  Clear to auscultation bilateral. Normal respiratory effort Extremities: no c/c/e  Medications Current Facility-Administered Medications  Medication Dose Route Frequency Provider Last Rate Last Dose  . sodium chloride 0.9 % injection 3 mL  3 mL Intravenous Q12H Nadara Mustardobert P Harris, MD       And  . sodium chloride 0.9 % injection 3 mL  3 mL Intravenous PRN Nadara Mustardobert P Harris, MD       And  . 0.9 %  sodium chloride infusion  250 mL Intravenous PRN Nadara Mustardobert P Harris, MD      . acetaminophen (TYLENOL) tablet 650 mg  650 mg Oral Q4H PRN Nadara Mustardobert P Harris, MD      . benzocaine-Menthol (DERMOPLAST) 20-0.5 % topical spray 1 application  1 application Topical PRN Nadara Mustardobert P Harris, MD   1 application at 03/02/15 2044  . witch hazel-glycerin (TUCKS) pad 1 application  1 application Topical PRN Nadara Mustardobert P Harris, MD       And  . dibucaine (NUPERCAINAL) 1 % rectal ointment 1 application  1 application Rectal PRN Nadara Mustardobert P Harris, MD      . diphenhydrAMINE (BENADRYL) capsule 25 mg  25 mg Oral Q6H PRN Nadara Mustardobert P Harris, MD      . ibuprofen (ADVIL,MOTRIN) tablet 600 mg  600 mg Oral 4 times per day Nadara Mustardobert P Harris, MD   600 mg at 03/03/15 1749   . [START ON 03/05/2015] Influenza vac split quadrivalent PF (FLUARIX) injection 0.5 mL  0.5 mL Intramuscular Tomorrow-1000 Nadara Mustardobert P Harris, MD      . lanolin ointment   Topical PRN Nadara Mustardobert P Harris, MD      . ondansetron [facility](ZOFRAN) tablet 4 mg  4 mg Oral Q4H PRN Nadara Mustardobert P Harris, MD       Or  . ondansetron [facility](ZOFRAN) injection 4 mg  4 mg Intravenous Q4H PRN Nadara Mustardobert P Harris, MD      . oxyCODONE-acetaminophen (PERCOCET/ROXICET) 5-325 MG per tablet 1 tablet  1 tablet Oral Q4H PRN Nadara Mustardobert P Harris, MD   1 tablet at 03/03/15 2216  . oxyCODONE-acetaminophen (PERCOCET/ROXICET) 5-325 MG per tablet 2 tablet  2 tablet Oral Q4H PRN Nadara Mustardobert P Harris, MD      . oxytocin (PITOCIN) IV infusion 40 units in LR 1000 mL  62.5 mL/hr Intravenous Continuous PRN Nadara Mustardobert P Harris, MD      . senna-docusate (Senokot-S) tablet 2 tablet  2 tablet Oral Q24H Harrel Lemonobert P  Tiburcio Pea, MD   2 tablet at 03/03/15 2215  . simethicone (MYLICON) chewable tablet 80 mg  80 mg Oral PRN Nadara Mustard, MD      . zolpidem [facility]) tablet 5 mg  5 mg Oral QHS PRN Nadara Mustard, MD         Recent Labs Lab 03/01/15 2258 03/03/15 0615  WBC 12.2* 15.5*  HGB 11.9* 11.3*  HCT 35.6 32.5*  PLT 175 126*   No results for input(s): NA, K, CL, CO2, BUN, CREATININE, LABGLOM, GLUCOSE, CALCIUM in the last 168 hours.  Assessment & Plan:  Pt doing well *Postpartum/postop: routine care *Dispo: home today  O POS / Rubella Immune / Varicella Immune/  RPR negative / HIV negative / HepBsAg negative / Tdap UTD: Yes/pap neg 2016/ Breast   / Follow up: Johnsie Kindred MD [facility] Pager [phone]

## 2015-04-17 ENCOUNTER — Ambulatory Visit: Payer: BLUE CROSS/BLUE SHIELD | Admitting: Urology

## 2016-10-28 ENCOUNTER — Ambulatory Visit: Payer: Self-pay | Admitting: Advanced Practice Midwife

## 2016-10-28 ENCOUNTER — Ambulatory Visit (INDEPENDENT_AMBULATORY_CARE_PROVIDER_SITE_OTHER): Payer: Managed Care, Other (non HMO) | Admitting: Advanced Practice Midwife

## 2016-10-28 ENCOUNTER — Encounter: Payer: Self-pay | Admitting: Advanced Practice Midwife

## 2016-10-28 VITALS — BP 100/60 | HR 67 | Ht 66.0 in | Wt 164.0 lb

## 2016-10-28 DIAGNOSIS — Z124 Encounter for screening for malignant neoplasm of cervix: Secondary | ICD-10-CM

## 2016-10-28 DIAGNOSIS — Z113 Encounter for screening for infections with a predominantly sexual mode of transmission: Secondary | ICD-10-CM

## 2016-10-28 DIAGNOSIS — Z01419 Encounter for gynecological examination (general) (routine) without abnormal findings: Secondary | ICD-10-CM

## 2016-10-28 LAB — HM PAP SMEAR: HM Pap smear: NEGATIVE

## 2016-10-28 NOTE — Progress Notes (Signed)
Patient ID: Carrie Brown, female   DOB: 08-06-1985, 31 y.o.   MRN: [id number]     Gynecology Annual Exam  PCP: Leim FabryAldridge, Barbara, MD  Chief Complaint:  Chief Complaint  Patient presents with  . Gynecologic Exam    History of Present Illness: Patient is a 31 y.o. G1P1001 presents for annual exam. The patient has no complaints today.   LMP: No LMP recorded. Patient is not currently having periods (Reason: IUD). Average Interval: NA  Postcoital Bleeding: no Dysmenorrhea: no   The patient is sexually active. She currently uses Mirena IUD for contraception. She denies dyspareunia.  The patient does not perform self breast exams.  There is no notable family history of breast or ovarian cancer in her family.  The patient wears seatbelts: yes.   The patient has regular exercise: yes.    The patient denies current symptoms of depression.    Review of Systems: Review of Systems  Constitutional: Negative.   HENT: Negative.   Eyes: Negative.   Respiratory: Negative.   Cardiovascular: Negative.   Gastrointestinal: Negative.   Genitourinary: Negative.   Musculoskeletal: Negative.   Skin: Negative.   Neurological: Negative.   Endo/Heme/Allergies: Negative.   Psychiatric/Behavioral: Negative.     Past Medical History:  Past Medical History:  Diagnosis Date  . ADHD (attention deficit hyperactivity disorder)     Past Surgical History:  Past Surgical History:  Procedure Laterality Date  . NO PAST SURGERIES      Gynecologic History:  No LMP recorded. Patient is not currently having periods (Reason: IUD). Contraception: IUD Last Pap: Results were: no abnormalities   Obstetric History: G1P1001  Family History:  Family History  Problem Relation Age of Onset  . Cervical cancer Mother   . Cervical cancer Maternal Aunt   . Lung cancer Paternal Aunt   . Lung cancer Paternal Grandmother   . Throat cancer Paternal Aunt     Social History:  Social History   Social  History  . Marital status: Married    Spouse name: N/A  . Number of children: N/A  . Years of education: N/A   Occupational History  . Not on file.   Social History Main Topics  . Smoking status: Never Smoker  . Smokeless tobacco: Never Used  . Alcohol use No  . Drug use: No  . Sexual activity: Yes    Birth control/ protection: IUD   Other Topics Concern  . Not on file   Social History Narrative  . No narrative on file    Allergies:  Allergies  Allergen Reactions  . Lisdexamfetamine Dimesylate Anxiety    emotional    Medications: Prior to Admission medications   Medication Sig Start Date End Date Taking? Authorizing Provider  diphenhydrAMINE (BENADRYL) 25 MG tablet Take 25 mg by mouth every 6 (six) hours as needed for sleep.    [provider]  methylphenidate 27 MG PO TB24 Take by mouth. 12/21/16 01/20/17  [provider]    Physical Exam Vitals: Blood pressure 100/60, pulse 67, height 5\' 6"  (1.676 m), weight 164 lb (74.4 kg), unknown if currently breastfeeding.  General: NAD HEENT: normocephalic, anicteric Thyroid: no enlargement, no palpable nodules Pulmonary: No increased work of breathing, CTAB Cardiovascular: RRR, distal pulses 2+ Breast: Breast symmetrical, no tenderness, no palpable nodules or masses, no skin or nipple retraction present, no nipple discharge.  No axillary or supraclavicular lymphadenopathy. Abdomen: NABS, soft, non-tender, non-distended.  Umbilicus without lesions.  No hepatomegaly, splenomegaly or  masses palpable. No evidence of hernia  Genitourinary:  External: Normal external female genitalia.  Normal urethral meatus, normal  Bartholin's and Skene's glands.    Vagina: Normal vaginal mucosa, no evidence of prolapse.    Cervix: Grossly normal in appearance, no bleeding, no CMT  Uterus: Non-enlarged, mobile, normal contour.    Adnexa: ovaries non-enlarged, no adnexal masses  Rectal: deferred  Lymphatic: no evidence of  inguinal lymphadenopathy Extremities: no edema, erythema, or tenderness Neurologic: Grossly intact Psychiatric: mood appropriate, affect full  Assessment: 31 y.o. G1P1001 well woman exam with PAP  Plan: Problem List Items Addressed This Visit    None    Visit Diagnoses    Well woman exam with routine gynecological exam    -  Primary   Relevant Orders   IGP,CtNg,AptimaHPV,rfx16/18,45   Cervical cancer screening       Relevant Orders   IGP,CtNg,AptimaHPV,rfx16/18,45   Screen for sexually transmitted diseases       Relevant Orders   IGP,CtNg,AptimaHPV,rfx16/18,45      1) STI screening was offered and accepted  2) ASCCP guidelines and rational discussed.  Patient opts for yearly screening interval  3) Contraception -Mirena good through 2021  4) Routine healthcare maintenance including cholesterol, diabetes screening discussed managed by PCP   5) Increase healthy lifestyle diet and continue exercise  6) Follow up 1 year for routine annual exam  [name], CNM

## 2016-10-30 LAB — IGP,CTNG,APTIMAHPV,RFX16/18,45
Chlamydia, Nuc. Acid Amp: NEGATIVE
GONOCOCCUS BY NUCLEIC ACID AMP: NEGATIVE
HPV APTIMA: NEGATIVE
PAP Smear Comment: 0

## 2017-04-17 IMAGING — US US RENAL
1 series · 14 of 25 positions shown · non-contrast
Comparison: None.

CLINICAL DATA: Acute onset of right flank pain.  Initial encounter.

EXAM:
RENAL / URINARY TRACT ULTRASOUND COMPLETE

[Series 1: us renal · 0.24mm/px · 14 of 34 slices shown]
[im 1/34]
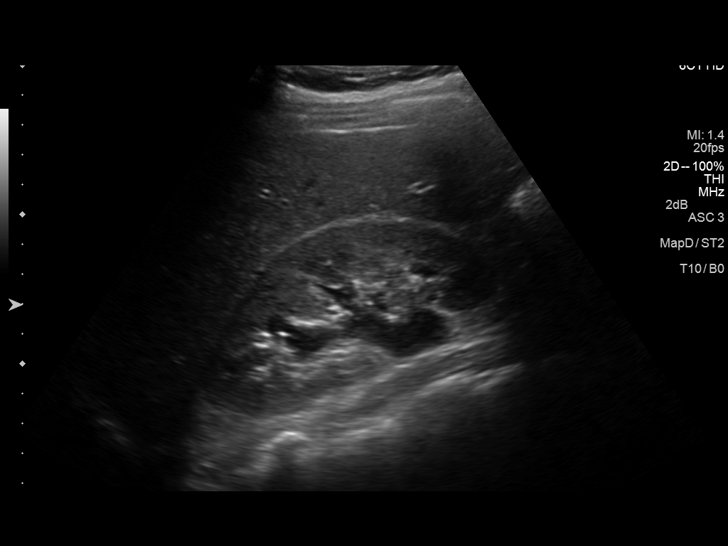
[im 3/34]
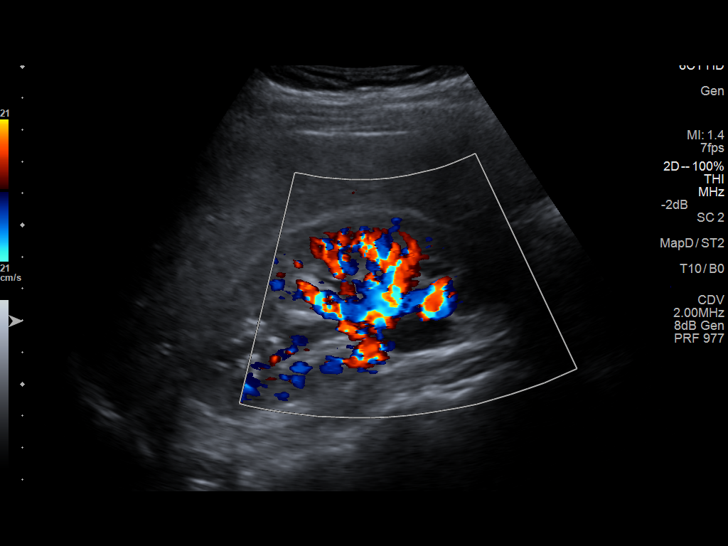
[im 6/34]
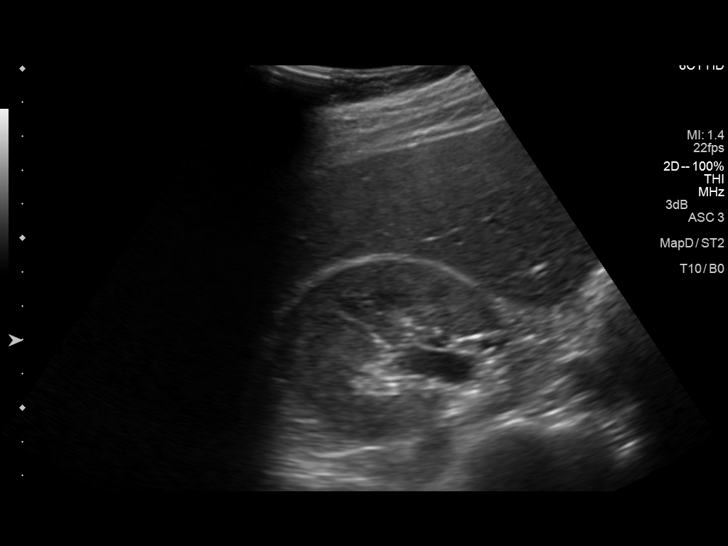
[im 9/34]
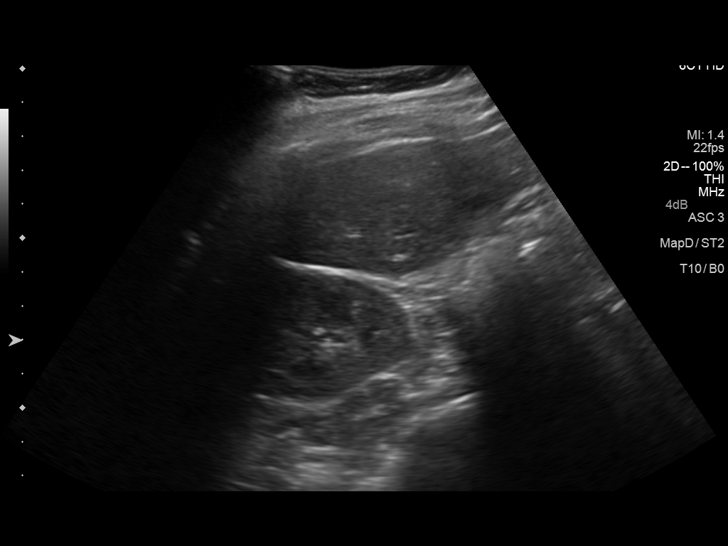
[im 12/34]
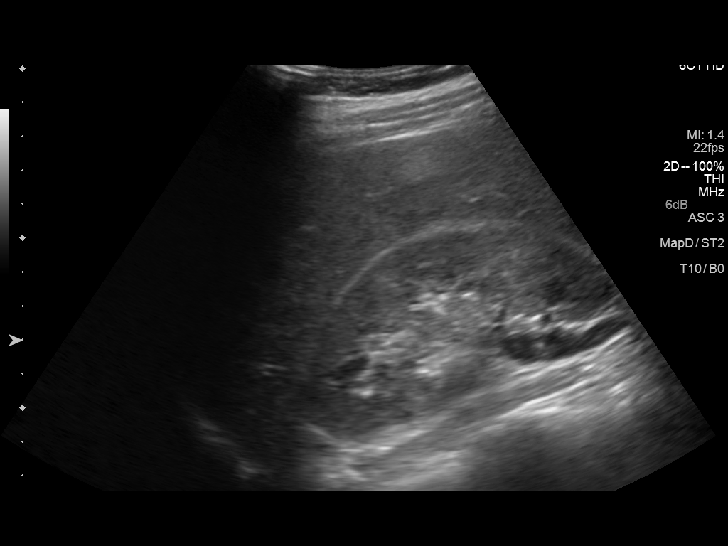
[im 13/34]
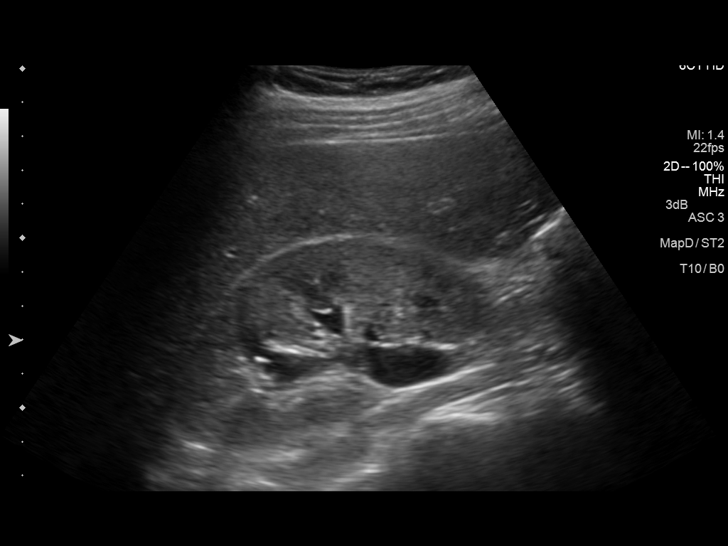
[im 16/34]
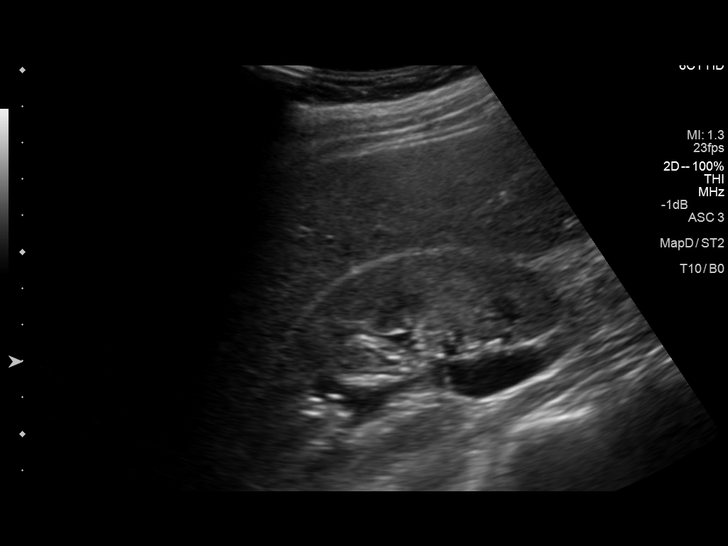
[im 18/34]
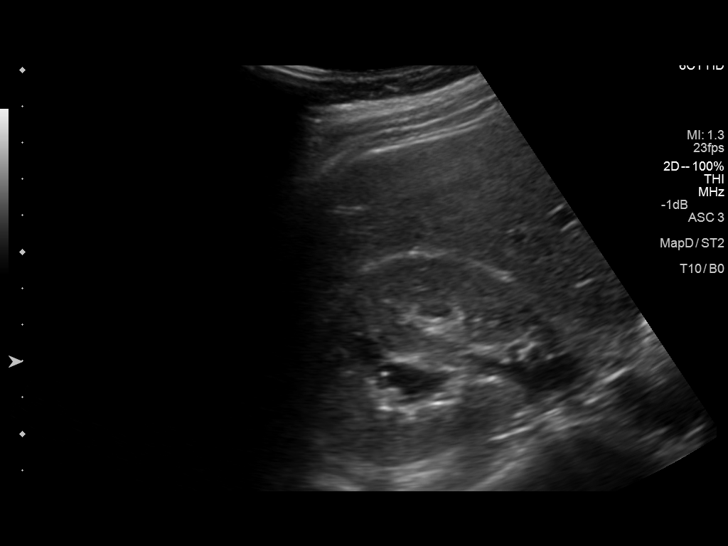
[im 21/34]
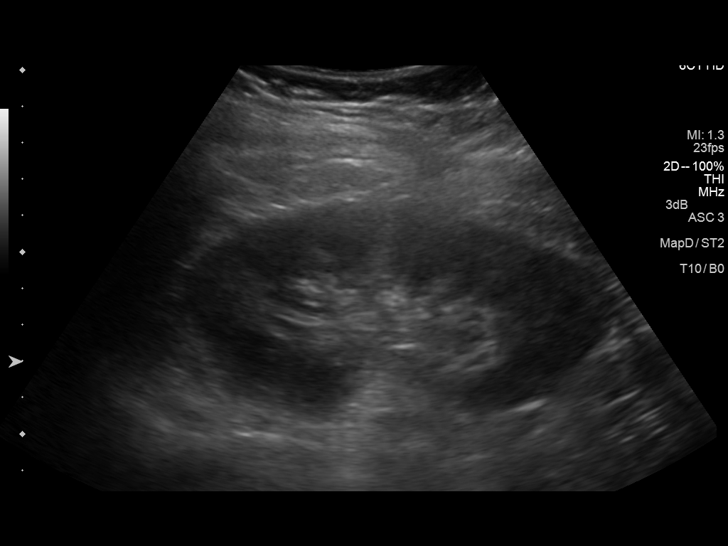
[im 23/34]
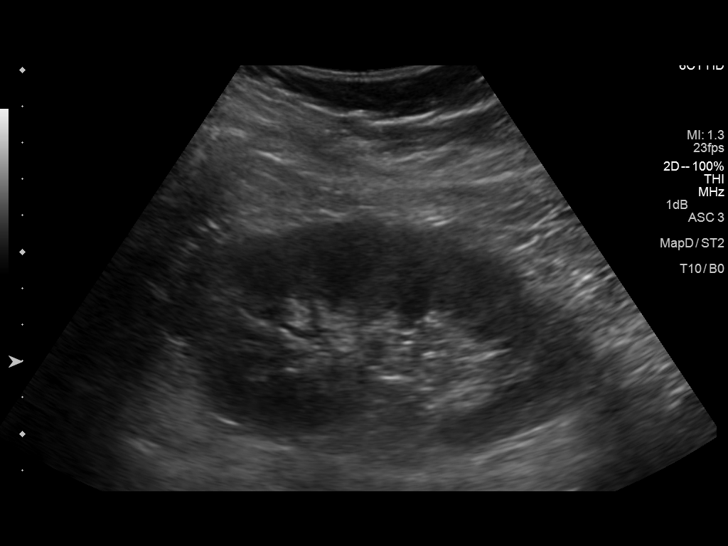
[im 25/34]
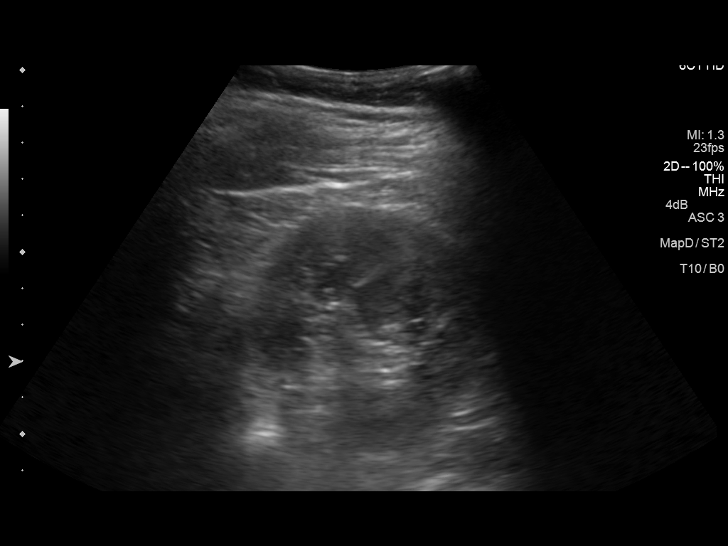
[im 28/34]
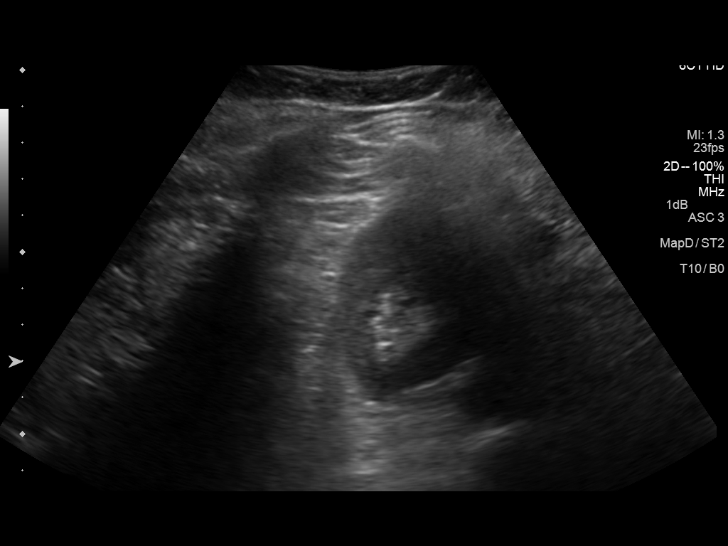
[im 31/34]
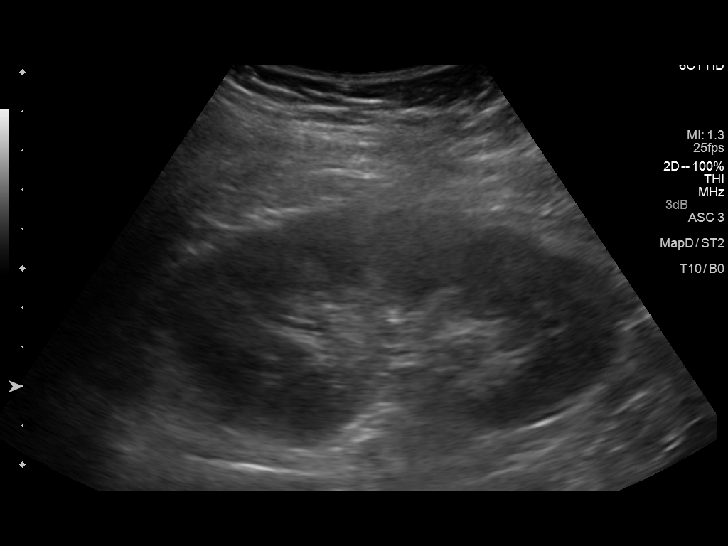
[im 34/34]
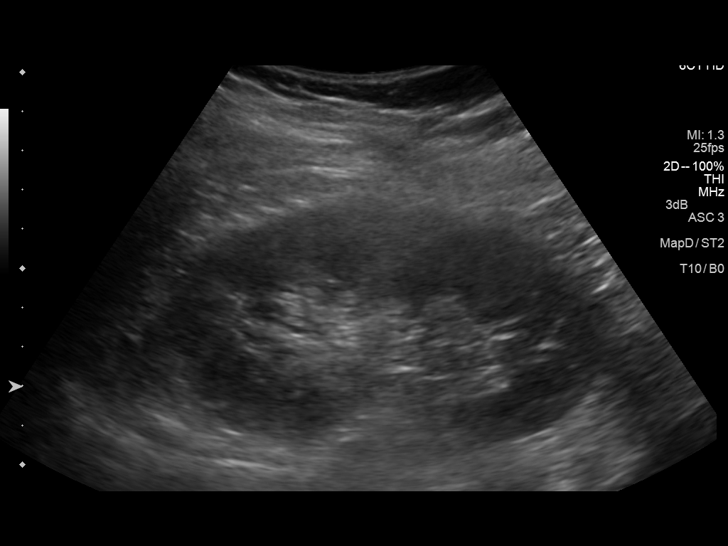

[14 of 25 positions shown; findings below may reference images not displayed]

FINDINGS: Right Kidney:

Length: 12.3 cm. Echogenicity within normal limits. There is
suggestion of minimal right-sided hydronephrosis. A suspected 5 mm
stone is noted at the upper pole of the right kidney.

Left Kidney:

Length: 11.6 cm. Echogenicity within normal limits. No mass or
hydronephrosis visualized.

Bladder:

Decompressed and not well assessed.
IMPRESSION: 1. Suggestion of minimal right-sided hydronephrosis, likely
reflecting hydronephrosis of pregnancy.
2. Suspect 5 mm nonobstructing stone at the upper pole of the right
kidney.

## 2017-10-08 ENCOUNTER — Ambulatory Visit (INDEPENDENT_AMBULATORY_CARE_PROVIDER_SITE_OTHER): Payer: Managed Care, Other (non HMO) | Admitting: Advanced Practice Midwife

## 2017-10-08 ENCOUNTER — Encounter: Payer: Self-pay | Admitting: Advanced Practice Midwife

## 2017-10-08 VITALS — BP 110/64 | HR 65 | Ht 66.0 in | Wt 161.0 lb

## 2017-10-08 DIAGNOSIS — Z30432 Encounter for removal of intrauterine contraceptive device: Secondary | ICD-10-CM

## 2017-10-08 NOTE — Progress Notes (Signed)
    GYNECOLOGY OFFICE PROCEDURE NOTE  Carrie Brown is a 32 y.o. G1P1001 here for IUD removal. The patient currently has a Mirena IUD placed 3 years ago.  No GYN concerns.  Last pap smear was on 10/28/2016 and was normal. She and her husband are attempting conception.  IUD Removal  Patient identified, informed consent performed, consent signed.  Discussed usual quick return to fertility once the IUD is removed. Time out was performed. Speculum placed in the vagina. The strings of the IUD were grasped and pulled using ring forceps. The IUD was successfully removed in its entirety.  Patient tolerated procedure well.   Patient was given post-procedure instructions.    Carrie Brown, CNM Carrie Brown, Carrie Brown  IUD insertion CPT [phone],  Carrie Brown [id number] Mirena [id number] Penni Bombard [phone] Paraguard J7300 Carrie Brown [id number] IUD remval [id number] Modifer 25, plus Modifer 79 is done during a global billing visit

## 2018-02-18 ENCOUNTER — Telehealth: Payer: Self-pay | Admitting: Advanced Practice Midwife

## 2018-02-18 NOTE — Telephone Encounter (Signed)
Patient is schedule Monday 02/23/18 at 10:50 with JEG for mirena insertion

## 2018-02-20 NOTE — Telephone Encounter (Signed)
Mirena reserved for this patient. 

## 2018-02-23 ENCOUNTER — Ambulatory Visit: Payer: Managed Care, Other (non HMO) | Admitting: Advanced Practice Midwife

## 2018-02-23 ENCOUNTER — Encounter: Payer: Self-pay | Admitting: Advanced Practice Midwife

## 2018-02-23 DIAGNOSIS — Z30430 Encounter for insertion of intrauterine contraceptive device: Secondary | ICD-10-CM

## 2018-02-23 NOTE — Patient Instructions (Signed)

## 2018-02-23 NOTE — Progress Notes (Signed)
   GYNECOLOGY OFFICE PROCEDURE NOTE  Carrie Brown is a 32 y.o. G1P1001 here for Mirena IUD insertion. No GYN concerns.  Last pap smear was on 10/28/2016 and was normal.  The patient is currently using none for contraception and her LMP is Patient's last menstrual period was 02/18/2018 (exact date).  The indication for her IUD is contraception/cycle control.  IUD Insertion Procedure Note Patient identified, informed consent performed, consent signed.   Discussed risks of irregular bleeding, cramping, infection, malpositioning, expulsion or uterine perforation of the IUD (1:1000 placements)  which may require further procedure such as laparoscopy.  IUD while effective at preventing pregnancy do not prevent transmission of sexually transmitted diseases and use of barrier methods for this purpose was discussed. Time out was performed.  Urine pregnancy test negative.  Speculum placed in the vagina.  Cervix visualized.  Cleaned with Betadine x 2.  Grasped posteriorly with a single tooth tenaculum.  Uterus sounded to 7 cm. IUD placed per manufacturer's recommendations.  Strings trimmed to 3 cm. Tenaculum was removed, good hemostasis noted.  Patient tolerated procedure well.   Patient was given post-procedure instructions.  She was advised to have backup contraception for one week.  Patient was also asked to check IUD strings periodically and follow up in 4-6 weeks for IUD check.  IUD insertion CPT 58300,  Skyla [id number] Mirena [id number] Liletta J7297 Paraguard J7300 Rutha Bouchard [phone] Modifer 25, plus Modifer 79 is done during a global billing visit    [name], CNM [facility], [facility]

## 2018-04-03 ENCOUNTER — Ambulatory Visit: Payer: Managed Care, Other (non HMO) | Admitting: Advanced Practice Midwife

## 2019-06-01 ENCOUNTER — Other Ambulatory Visit (HOSPITAL_COMMUNITY)
Admission: RE | Admit: 2019-06-01 | Discharge: 2019-06-01 | Disposition: A | Discharge status: Home / Self Care | Payer: PRIVATE HEALTH INSURANCE | Source: Ambulatory Visit | Attending: Advanced Practice Midwife | Admitting: Advanced Practice Midwife

## 2019-06-01 ENCOUNTER — Other Ambulatory Visit: Payer: Self-pay

## 2019-06-01 ENCOUNTER — Encounter: Payer: Self-pay | Admitting: Advanced Practice Midwife

## 2019-06-01 ENCOUNTER — Ambulatory Visit (INDEPENDENT_AMBULATORY_CARE_PROVIDER_SITE_OTHER): Payer: No Typology Code available for payment source | Admitting: Advanced Practice Midwife

## 2019-06-01 VITALS — BP 122/74 | Ht 66.0 in | Wt 169.0 lb

## 2019-06-01 DIAGNOSIS — Z124 Encounter for screening for malignant neoplasm of cervix: Secondary | ICD-10-CM | POA: Insufficient documentation

## 2019-06-01 DIAGNOSIS — Z01419 Encounter for gynecological examination (general) (routine) without abnormal findings: Secondary | ICD-10-CM | POA: Diagnosis not present

## 2019-06-01 DIAGNOSIS — Z30432 Encounter for removal of intrauterine contraceptive device: Secondary | ICD-10-CM

## 2019-06-01 NOTE — Progress Notes (Signed)
Gynecology Annual Exam   PCP: Leim Fabry, MD  Chief Complaint:  Chief Complaint  Patient presents with  . Annual Exam    History of Present Illness: Patient is a 34 y.o. G1P1001 presents for annual exam. The patient has no gyn complaints today. She and her husband are attempting to conceive in the near future and she requests IUD removal today.  LMP: No LMP recorded. (Menstrual status: IUD). Average Interval: around every 6 months Duration of flow: 1 or 2 days Heavy Menses: no Clots: no Intermenstrual Bleeding: no Postcoital Bleeding: no Dysmenorrhea: no  The patient is sexually active. She currently uses IUD for contraception. She denies dyspareunia.  The patient does perform self breast exams.  There is no notable family history of breast or ovarian cancer in her family.  The patient wears seatbelts: yes.   The patient has regular exercise: she walks 30 minutes every day. She admits improving diet recently. She admits adequate hydration and sleep.    The patient denies current symptoms of depression.    Review of Systems: Review of Systems  Constitutional: Negative.   HENT: Negative.   Eyes: Negative.   Respiratory: Negative.   Cardiovascular: Negative.   Gastrointestinal: Negative.   Genitourinary: Negative.   Musculoskeletal: Negative.   Skin: Negative.   Neurological: Negative.   Endo/Heme/Allergies: Negative.   Psychiatric/Behavioral: Negative.     Past Medical History:  Past Medical History:  Diagnosis Date  . ADHD (attention deficit hyperactivity disorder)     Past Surgical History:  Past Surgical History:  Procedure Laterality Date  . NO PAST SURGERIES      Gynecologic History:  No LMP recorded. (Menstrual status: IUD). Contraception: IUD Last Pap: 2.5 years ago Results were: no abnormalities   Obstetric History: G1P1001  Family History:  Family History  Problem Relation Age of Onset  . Cervical cancer Mother   . Cervical cancer  Maternal Aunt   . Lung cancer Paternal Aunt   . Lung cancer Paternal Grandmother   . Throat cancer Paternal Aunt   . Other Father        Cardiac Arrest  . Other Paternal Grandfather        Cardiac Arrest    Social History:  Social History   Socioeconomic History  . Marital status: Married    Spouse name: Not on file  . Number of children: Not on file  . Years of education: Not on file  . Highest education level: Not on file  Occupational History  . Not on file  Tobacco Use  . Smoking status: Never Smoker  . Smokeless tobacco: Never Used  Substance and Sexual Activity  . Alcohol use: No  . Drug use: No  . Sexual activity: Yes    Birth control/protection: I.U.D.    Comment: Mirena  Other Topics Concern  . Not on file  Social History Narrative  . Not on file   Social Determinants of Health   Financial Resource Strain:   . Difficulty of Paying Living Expenses: Not on file  Food Insecurity:   . Worried About Programme researcher, broadcasting/film/video in the Last Year: Not on file  . Ran Out of Food in the Last Year: Not on file  Transportation Needs:   . Lack of Transportation (Medical): Not on file  . Lack of Transportation (Non-Medical): Not on file  Physical Activity:   . Days of Exercise per Week: Not on file  . Minutes of Exercise per Session: Not  on file  Stress:   . Feeling of Stress : Not on file  Social Connections:   . Frequency of Communication with Friends and Family: Not on file  . Frequency of Social Gatherings with Friends and Family: Not on file  . Attends Religious Services: Not on file  . Active Member of Clubs or Organizations: Not on file  . Attends Banker Meetings: Not on file  . Marital Status: Not on file  Intimate Partner Violence:   . Fear of Current or Ex-Partner: Not on file  . Emotionally Abused: Not on file  . Physically Abused: Not on file  . Sexually Abused: Not on file    Allergies:  Allergies  Allergen Reactions  . Lisdexamfetamine  Dimesylate Anxiety    emotional    Medications: Prior to Admission medications   Medication Sig Start Date End Date Taking? Authorizing Provider  montelukast (SINGULAIR) 10 MG tablet  09/09/17  Yes [provider]  fluticasone (FLONASE) 50 MCG/ACT nasal spray SHAKE LQ AND U 2 SPRAYS IEN ONCE D 09/09/17   [provider]  levocetirizine (XYZAL) 5 MG tablet Take 5 mg by mouth every evening.    [provider]  levonorgestrel (MIRENA) 20 MCG/24HR IUD 1 each by Intrauterine route once.    [provider]    Physical Exam Vitals: Blood pressure 122/74, height 5\' 6"  (1.676 m), weight 169 lb (76.7 kg).  General: NAD HEENT: normocephalic, anicteric Thyroid: no enlargement, no palpable nodules Pulmonary: No increased work of breathing, CTAB Cardiovascular: RRR, distal pulses 2+ Breast: Breast symmetrical, no tenderness, no palpable nodules or masses, no skin or nipple retraction present, no nipple discharge.  No axillary or supraclavicular lymphadenopathy. Abdomen: NABS, soft, non-tender, non-distended.  Umbilicus without lesions.  No hepatomegaly, splenomegaly or masses palpable. No evidence of hernia  Genitourinary:  External: Normal external female genitalia.  Normal urethral meatus, normal Bartholin's and Skene's glands.    Vagina: Normal vaginal mucosa, no evidence of prolapse.    Cervix: Grossly normal in appearance, no bleeding, no CMT  Uterus: Non-enlarged, mobile, normal contour.    Adnexa: ovaries non-enlarged, no adnexal masses  Rectal: deferred  Lymphatic: no evidence of inguinal lymphadenopathy Extremities: no edema, erythema, or tenderness Neurologic: Grossly intact Psychiatric: mood appropriate, affect full    Assessment: 34 y.o. G1P1001 routine annual exam  Plan: Problem List Items Addressed This Visit    None    Visit Diagnoses    Well woman exam with routine gynecological exam    -  Primary   Relevant Orders   Cytology - PAP    Cervical cancer screening       Relevant Orders   Cytology - PAP      1) STI screening  was offered and declined  2)  ASCCP guidelines and rationale discussed.  Patient opts for every 3 years screening interval  3) Contraception - the patient is currently using  IUD.  She is attempting to conceive in the near future  4) Routine healthcare maintenance including cholesterol, diabetes screening discussed Declines  5) Return in about 1 year (around 05/31/2020) for annual established gyn and as needed for pregnancy   06/02/2020, CNM [facility]  [facility] 06/01/2019, 3:59 PM       GYNECOLOGY OFFICE PROCEDURE NOTE  AYEISHA LINDENBERGER is a 34 y.o. G1P1001 here for IUD removal. The patient currently has a Mirena IUD placed on 03/05/2018.  No GYN concerns.  Last pap smear was  on 10/28/2016 and was normal.  IUD Removal and Reinsertion  Patient identified, informed consent performed, consent signed.  Time out was performed. Speculum placed in the vagina. The strings of the IUD were grasped and pulled using ring forceps. The IUD was successfully removed in its entirety.  Patient tolerated procedure well.     Rod Can, CNM [facility]  Modifer 25, plus Modifer 79 is done during a global billing visit

## 2019-06-03 LAB — CYTOLOGY - PAP
Comment: NEGATIVE
Diagnosis: NEGATIVE
High risk HPV: NEGATIVE

## 2019-06-29 NOTE — Telephone Encounter (Signed)
Mirena rcvd/charged 02/23/18 

## 2020-06-15 ENCOUNTER — Encounter: Payer: Self-pay | Admitting: Advanced Practice Midwife

## 2020-06-15 ENCOUNTER — Ambulatory Visit (INDEPENDENT_AMBULATORY_CARE_PROVIDER_SITE_OTHER): Payer: 59 | Admitting: Advanced Practice Midwife

## 2020-06-15 ENCOUNTER — Other Ambulatory Visit: Payer: Self-pay

## 2020-06-15 ENCOUNTER — Other Ambulatory Visit (HOSPITAL_COMMUNITY)
Admission: RE | Admit: 2020-06-15 | Discharge: 2020-06-15 | Disposition: A | Discharge status: Home / Self Care | Payer: 59 | Source: Ambulatory Visit | Attending: Advanced Practice Midwife | Admitting: Advanced Practice Midwife

## 2020-06-15 VITALS — BP 100/71 | HR 80 | Ht 66.0 in | Wt 163.1 lb

## 2020-06-15 DIAGNOSIS — Z01419 Encounter for gynecological examination (general) (routine) without abnormal findings: Secondary | ICD-10-CM

## 2020-06-15 DIAGNOSIS — Z30011 Encounter for initial prescription of contraceptive pills: Secondary | ICD-10-CM

## 2020-06-15 DIAGNOSIS — N898 Other specified noninflammatory disorders of vagina: Secondary | ICD-10-CM | POA: Diagnosis not present

## 2020-06-15 DIAGNOSIS — F419 Anxiety disorder, unspecified: Secondary | ICD-10-CM

## 2020-06-15 MED ORDER — NORETHIN ACE-ETH ESTRAD-FE 1-20 MG-MCG PO TABS: 1.0000 | ORAL_TABLET | Freq: Every day | ORAL | 3 refills | Status: DC

## 2020-06-15 MED ORDER — FLUCONAZOLE 150 MG PO TABS: 150.0000 mg | ORAL_TABLET | Freq: Once | ORAL | 3 refills | Status: AC

## 2020-06-15 MED ORDER — HYDROXYZINE HCL 25 MG PO TABS: 25.0000 mg | ORAL_TABLET | Freq: Three times a day (TID) | ORAL | 4 refills | Status: DC | PRN

## 2020-06-15 MED ORDER — METRONIDAZOLE 0.75 % VA GEL: 1.0000 | Freq: Every day | VAGINAL | 1 refills | Status: AC

## 2020-06-15 NOTE — Progress Notes (Signed)
Gynecology Annual Exam   PCP: Leim Fabry, MD  Chief Complaint:  Chief Complaint  Patient presents with  . Gynecologic Exam    Annual - keeps getting yeast infections off and on, discuss OCP. RM 3    History of Present Illness: Patient is a 35 y.o. G1P1001 presents for annual exam. The patient has complaint today of ongoing vaginal itching, discharge, odor and irritation. Her symptoms started a year ago. Her IUD was removed about 2 years ago and she is having regular periods since then. She thinks the periods could be aggravating the symptoms. She has tried OTC monistat without relief. She also has had new allergy symptoms in the past year. She is going through allergy testing and has not yet determined what she is allergic to. She assumed some of the vaginal symptoms may have been related to the allergies and therefore she has not had any testing to determine if she has yeast. I will send Rx's to treat both BV and yeast if lab results are positive.   She also mentions severe mood reactions around the time of her cycle. She is off most medications currently due to elimination method of allergy testing. She would like to try something to ease those symptoms. We discussed Atarax as an option. She does not want an anti-depressant.   She would also like to start low dose OCPs to see if that might decrease her symptoms of mood/yeast.     LMP: Patient's last menstrual period was 05/24/2020 (exact date). Average Interval: regular, 28 days Duration of flow: 5 days Heavy Menses: no Clots: no Intermenstrual Bleeding: no Postcoital Bleeding: no Dysmenorrhea: no  The patient is sexually active. She currently uses condoms for contraception. She admits some irritation with intercourse and uses lubricant.  The patient does perform self breast exams.  There is no notable family history of breast or ovarian cancer in her family.  The patient wears seatbelts: yes.   The patient has regular  exercise: she walks regularly, she admits healthy diet and adequate hydration, she usually gets 6 hours of sleep.    The patient denies current symptoms of depression.    Review of Systems: Review of Systems  Constitutional: Negative for chills and fever.  HENT: Negative for congestion, ear discharge, ear pain, hearing loss, sinus pain and sore throat.   Eyes: Negative for blurred vision and double vision.  Respiratory: Negative for cough, shortness of breath and wheezing.   Cardiovascular: Negative for chest pain, palpitations and leg swelling.  Gastrointestinal: Negative for abdominal pain, blood in stool, constipation, diarrhea, heartburn, melena, nausea and vomiting.  Genitourinary: Negative for dysuria, flank pain, frequency, hematuria and urgency.       Positive for vaginal itching, discharge, odor, irritation  Musculoskeletal: Negative for back pain, joint pain and myalgias.  Skin: Negative for itching and rash.  Neurological: Negative for dizziness, tingling, tremors, sensory change, speech change, focal weakness, seizures, loss of consciousness, weakness and headaches.  Endo/Heme/Allergies: Negative for environmental allergies. Does not bruise/bleed easily.       Positive for allergies  Psychiatric/Behavioral: Negative for depression, hallucinations, memory loss, substance abuse and suicidal ideas. The patient is not nervous/anxious and does not have insomnia.        Positive for anxiety    Past Medical History:  Patient Active Problem List   Diagnosis Date Noted  . Abnormal cervical Pap smear with positive HPV DNA test 09/10/2013  . 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  ADHD (attention deficit hyperactivity disorder) 11/04/2011    Overview:  She saw Dr. Elna Breslow and had a full eval for ADHD. She was diagnosed with ADHD     Past Surgical History:  Past Surgical History:  Procedure Laterality Date  . NO PAST SURGERIES      Gynecologic History:  Patient's last  menstrual period was 05/24/2020 (exact date). Contraception: condoms Last Pap: Results were: no abnormalities   Obstetric History: G1P1001  Family History:  Family History  Problem Relation Age of Onset  . Cervical cancer Mother   . Cervical cancer Maternal Aunt   . Lung cancer Paternal Aunt   . Lung cancer Paternal Grandmother   . Throat cancer Paternal Aunt   . Other Father        Cardiac Arrest  . Other Paternal Grandfather        Cardiac Arrest    Social History:  Social History   Socioeconomic History  . Marital status: Married    Spouse name: Not on file  . Number of children: Not on file  . Years of education: Not on file  . Highest education level: Not on file  Occupational History  . Not on file  Tobacco Use  . Smoking status: Former Games developer  . Smokeless tobacco: Never Used  Vaping Use  . Vaping Use: Never used  Substance and Sexual Activity  . Alcohol use: No  . Drug use: No  . Sexual activity: Yes    Comment: IUD removed 10/20  Other Topics Concern  . Not on file  Social History Narrative  . Not on file   Social Determinants of Health   Financial Resource Strain: Not on file  Food Insecurity: Not on file  Transportation Needs: Not on file  Physical Activity: Not on file  Stress: Not on file  Social Connections: Not on file  Intimate Partner Violence: Not on file    Allergies:  Allergies  Allergen Reactions  . Methylphenidate Rash and Nausea And Vomiting    And RITALIN- also causes vomiting  . Lisdexamfetamine Dimesylate Anxiety    emotional    Medications: Prior to Admission medications   Medication Sig Start Date End Date Taking? Authorizing Provider  EPINEPHrine 0.3 mg/0.3 mL IJ SOAJ injection INJECT 0.3 MLS INTO THE MUSCLE ONCE AS NEEDED FOR UP TO 1 DOSE 02/18/20  Yes [provider]  fluconazole (DIFLUCAN) 150 MG tablet Take 1 tablet (150 mg total) by mouth once for 1 dose. Can take additional dose three days later if  symptoms persist 06/15/20 06/15/20 Yes [name], CNM  fluticasone (FLONASE) 50 MCG/ACT nasal spray SHAKE LQ AND U 2 SPRAYS IEN ONCE D 09/09/17  Yes [provider]  hydrOXYzine (ATARAX/VISTARIL) 25 MG tablet Take 1 tablet (25 mg total) by mouth every 8 (eight) hours as needed for anxiety. 06/15/20  Yes [name], CNM  levocetirizine (XYZAL) 5 MG tablet Take 5 mg by mouth every evening.   Yes [provider]  metroNIDAZOLE (METROGEL) 0.75 % vaginal gel Place 1 Applicatorful vaginally at bedtime for 5 days. 06/15/20 06/20/20 Yes [name], CNM  montelukast (SINGULAIR) 10 MG tablet  09/09/17  Yes [provider]  norethindrone-ethinyl estradiol (JUNEL FE 1/20) 1-20 MG-MCG tablet Take 1 tablet by mouth daily. 06/15/20  Yes [name], CNM    Physical Exam Vitals: Blood pressure 100/71, pulse 80, height 5\' 6"  (1.676 m), weight 163 lb 2 oz (74 kg), last menstrual period 05/24/2020.  General: NAD HEENT: normocephalic,  anicteric Thyroid: no enlargement, no palpable nodules Pulmonary: No increased work of breathing, CTAB Cardiovascular: RRR, distal pulses 2+ Breast: Breast symmetrical, no tenderness, no palpable nodules or masses, no skin or nipple retraction present, no nipple discharge.  No axillary or supraclavicular lymphadenopathy. Abdomen: NABS, soft, non-tender, non-distended.  Umbilicus without lesions.  No hepatomegaly, splenomegaly or masses palpable. No evidence of hernia  Genitourinary:  External: Normal external female genitalia.  Normal urethral meatus, normal Bartholin's and Skene's glands.    Vagina: Normal vaginal mucosa, no evidence of prolapse. Scant thin white discharge    Cervix: not evaluated  Uterus: Non-enlarged, mobile, normal contour.    Adnexa: ovaries non-enlarged, no adnexal masses  Rectal: deferred  Lymphatic: no evidence of inguinal lymphadenopathy Extremities: no edema, erythema, or tenderness Neurologic: Grossly intact Psychiatric:  mood appropriate, affect full   Assessment: 35 y.o. G1P1001 routine annual exam  Plan: Problem List Items Addressed This Visit   None   Visit Diagnoses    Well woman exam with routine gynecological exam    -  Primary   Relevant Medications   norethindrone-ethinyl estradiol (JUNEL FE 1/20) 1-20 MG-MCG tablet   Vagina itching       Relevant Medications   fluconazole (DIFLUCAN) 150 MG tablet   Other Relevant Orders   Cervicovaginal ancillary only   Vaginal discharge       Relevant Medications   metroNIDAZOLE (METROGEL) 0.75 % vaginal gel   fluconazole (DIFLUCAN) 150 MG tablet   Other Relevant Orders   Cervicovaginal ancillary only   Vaginal odor       Relevant Medications   metroNIDAZOLE (METROGEL) 0.75 % vaginal gel   Other Relevant Orders   Cervicovaginal ancillary only   Vaginal irritation       Relevant Medications   metroNIDAZOLE (METROGEL) 0.75 % vaginal gel   Other Relevant Orders   Cervicovaginal ancillary only   Anxiety       Relevant Medications   hydrOXYzine (ATARAX/VISTARIL) 25 MG tablet   Encounter for initial prescription of contraceptive pills       Relevant Medications   norethindrone-ethinyl estradiol (JUNEL FE 1/20) 1-20 MG-MCG tablet      1) STI screening  was offered and declined  2)  ASCCP guidelines and rationale discussed.  Patient opts for every 3 years screening interval. PAP due in 2024  3) Contraception - the patient is currently using  condoms.  She is interested in changing to [facility]. Junel prescribed  4) Routine healthcare maintenance including cholesterol, diabetes screening discussed Declines   5) Rx Atarax PRN for anxiety  6) Rx Metro Gel if BV+ on lab 7) Rx Diflucan if yeast+ on lab  8) Return in about 1 year (around 06/15/2021) for annual established gyn.   [name], CNM [facility] 06/15/2020, 10:25 AM

## 2020-06-19 LAB — CERVICOVAGINAL ANCILLARY ONLY
Bacterial Vaginitis (gardnerella): NEGATIVE
Candida Glabrata: NEGATIVE
Candida Vaginitis: NEGATIVE
Comment: NEGATIVE
Comment: NEGATIVE
Comment: NEGATIVE

## 2020-07-04 ENCOUNTER — Telehealth: Payer: Self-pay | Admitting: Advanced Practice Midwife

## 2020-07-04 NOTE — Telephone Encounter (Signed)
Patient coming in on 07/21/2020 at 9:10 with JEG for Mirena insertion

## 2020-07-04 NOTE — Telephone Encounter (Signed)
Noted. Will order to arrive by apt date/time. 

## 2020-07-19 NOTE — Telephone Encounter (Signed)
Mirena reserved for this patient. 

## 2020-07-21 ENCOUNTER — Ambulatory Visit (INDEPENDENT_AMBULATORY_CARE_PROVIDER_SITE_OTHER): Payer: 59 | Admitting: Advanced Practice Midwife

## 2020-07-21 ENCOUNTER — Encounter: Payer: Self-pay | Admitting: Advanced Practice Midwife

## 2020-07-21 ENCOUNTER — Other Ambulatory Visit: Payer: Self-pay

## 2020-07-21 VITALS — BP 100/62 | HR 80 | Ht 66.0 in | Wt 161.0 lb

## 2020-07-21 DIAGNOSIS — Z30430 Encounter for insertion of intrauterine contraceptive device: Secondary | ICD-10-CM

## 2020-07-21 NOTE — Progress Notes (Signed)
   GYNECOLOGY OFFICE PROCEDURE NOTE  Carrie Brown is a 35 y.o. G1P1001 here for Mirena IUD insertion. No GYN concerns.  Last pap smear was on 06/01/19 and was normal.  The patient is currently using OCP for contraception and her LMP is Patient's last menstrual period was 07/15/2020..  The indication for her IUD is contraception/cycle control.  Review of Systems  Constitutional: Negative for chills and fever.  HENT: Negative for congestion, ear discharge, ear pain, hearing loss, sinus pain and sore throat.   Eyes: Negative for blurred vision and double vision.  Respiratory: Negative for cough, shortness of breath and wheezing.   Cardiovascular: Negative for chest pain, palpitations and leg swelling.  Gastrointestinal: Negative for abdominal pain, blood in stool, constipation, diarrhea, heartburn, melena, nausea and vomiting.  Genitourinary: Negative for dysuria, flank pain, frequency, hematuria and urgency.  Musculoskeletal: Negative for back pain, joint pain and myalgias.  Skin: Negative for itching and rash.  Neurological: Negative for dizziness, tingling, tremors, sensory change, speech change, focal weakness, seizures, loss of consciousness, weakness and headaches.  Endo/Heme/Allergies: Negative for environmental allergies. Does not bruise/bleed easily.  Psychiatric/Behavioral: Negative for depression, hallucinations, memory loss, substance abuse and suicidal ideas. The patient is not nervous/anxious and does not have insomnia.    Vital Signs: BP 100/62 (Cuff Size: Normal)   Pulse 80   Ht 5\' 6"  (1.676 m)   Wt 161 lb (73 kg)   LMP 07/15/2020   BMI 25.99 kg/m  Constitutional: Well nourished, well developed female in no acute distress.  HEENT: normal Skin: Warm and dry.   Extremity: no edema  Respiratory: Clear to auscultation bilateral. Normal respiratory effort Neuro: DTRs 2+, Cranial nerves grossly intact Psych: Alert and Oriented x3. No memory deficits. Normal mood and affect.   MS: normal gait, normal bilateral lower extremity ROM/strength/stability.  Pelvic exam: (female chaperone present) is not limited by body habitus EGBUS: within normal limits Vagina: within normal limits and with normal mucosa  Cervix: normal appearance   IUD Insertion Procedure Note Patient identified, informed consent performed, consent signed.   Discussed risks of irregular bleeding, cramping, infection, malpositioning, expulsion or uterine perforation of the IUD (1:1000 placements)  which may require further procedure such as laparoscopy.  IUD while effective at preventing pregnancy do not prevent transmission of sexually transmitted diseases and use of barrier methods for this purpose was discussed. Time out was performed.  Urine pregnancy test negative.  Speculum placed in the vagina.  Cervix visualized.  Cleaned with Betadine x 3.  Grasped posteriorly with a single tooth tenaculum.  Uterus sounded to 7 cm. IUD placed per manufacturer's recommendations.  Strings trimmed to 3 cm. Tenaculum was removed, good hemostasis noted.  Patient tolerated procedure well.   Patient was given post-procedure instructions.  She was advised to have backup contraception for one week.  Patient was also asked to check IUD strings periodically and follow up in 4-6 weeks for IUD check.  IUD insertion CPT 58300,  Skyla [id number] Mirena [id number] Liletta [id number] Paraguard [id number] 09/14/2020 [phone] Modifer 25, plus Modifer 79 is done during a global billing visit    [id number], CNM [facility] 07/21/2020, 9:31 AM

## 2020-08-18 ENCOUNTER — Ambulatory Visit: Payer: Self-pay | Admitting: Advanced Practice Midwife

## 2021-01-20 ENCOUNTER — Emergency Department
Admission: EM | Admit: 2021-01-20 | Discharge: 2021-01-20 | Disposition: A | Discharge status: Home / Self Care | Payer: BC Managed Care – PPO | Attending: Emergency Medicine | Admitting: Emergency Medicine

## 2021-01-20 ENCOUNTER — Emergency Department: Payer: BC Managed Care – PPO

## 2021-01-20 ENCOUNTER — Other Ambulatory Visit: Payer: Self-pay

## 2021-01-20 DIAGNOSIS — Z87891 Personal history of nicotine dependence: Secondary | ICD-10-CM | POA: Diagnosis not present

## 2021-01-20 DIAGNOSIS — T7840XA Allergy, unspecified, initial encounter: Secondary | ICD-10-CM | POA: Insufficient documentation

## 2021-01-20 DIAGNOSIS — R0602 Shortness of breath: Secondary | ICD-10-CM

## 2021-01-20 DIAGNOSIS — R112 Nausea with vomiting, unspecified: Secondary | ICD-10-CM | POA: Insufficient documentation

## 2021-01-20 LAB — CBC WITH DIFFERENTIAL/PLATELET
Abs Immature Granulocytes: 0.04 10*3/uL (ref 0.00–0.07)
Basophils Absolute: 0.1 10*3/uL (ref 0.0–0.1)
Basophils Relative: 1 %
Eosinophils Absolute: 0 10*3/uL (ref 0.0–0.5)
Eosinophils Relative: 0 %
HCT: 39.6 % (ref 36.0–46.0)
Hemoglobin: 13.9 g/dL (ref 12.0–15.0)
Immature Granulocytes: 0 %
Lymphocytes Relative: 9 %
Lymphs Abs: 1.1 10*3/uL (ref 0.7–4.0)
MCH: 33.7 pg (ref 26.0–34.0)
MCHC: 35.1 g/dL (ref 30.0–36.0)
MCV: 96.1 fL (ref 80.0–100.0)
Monocytes Absolute: 0.6 10*3/uL (ref 0.1–1.0)
Monocytes Relative: 5 %
Neutro Abs: 10.3 10*3/uL — ABNORMAL HIGH (ref 1.7–7.7)
Neutrophils Relative %: 85 %
Platelets: 232 10*3/uL (ref 150–400)
RBC: 4.12 MIL/uL (ref 3.87–5.11)
RDW: 11.9 % (ref 11.5–15.5)
WBC: 12.1 10*3/uL — ABNORMAL HIGH (ref 4.0–10.5)
nRBC: 0 % (ref 0.0–0.2)

## 2021-01-20 LAB — COMPREHENSIVE METABOLIC PANEL WITH GFR
ALT: 12 U/L (ref 0–44)
AST: 17 U/L (ref 15–41)
Albumin: 4.4 g/dL (ref 3.5–5.0)
Alkaline Phosphatase: 50 U/L (ref 38–126)
Anion gap: 9 (ref 5–15)
BUN: 10 mg/dL (ref 6–20)
CO2: 26 mmol/L (ref 22–32)
Calcium: 9.1 mg/dL (ref 8.9–10.3)
Chloride: 102 mmol/L (ref 98–111)
Creatinine, Ser: 0.77 mg/dL (ref 0.44–1.00)
GFR, Estimated: >60 mL/min
Glucose, Bld: 106 mg/dL — ABNORMAL HIGH (ref 70–99)
Potassium: 3.7 mmol/L (ref 3.5–5.1)
Sodium: 137 mmol/L (ref 135–145)
Total Bilirubin: 0.5 mg/dL (ref 0.3–1.2)
Total Protein: 7.3 g/dL (ref 6.5–8.1)

## 2021-01-20 LAB — TROPONIN I (HIGH SENSITIVITY): Troponin I (High Sensitivity): <2 ng/L

## 2021-01-20 MED ORDER — ONDANSETRON 4 MG PO TBDP: 4.0000 mg | ORAL_TABLET | Freq: Three times a day (TID) | ORAL | 0 refills | Status: AC | PRN

## 2021-01-20 NOTE — ED Provider Notes (Signed)
[facility] Emergency Department Provider Note   ____________________________________________   Event Date/Time   First MD Initiated Contact with Patient 01/20/21 1955     (approximate)  I have reviewed the triage vital signs and the nursing notes.   HISTORY  Chief Complaint Allergic Reaction    HPI Carrie Brown is a 35 y.o. female who presents for multiple symptoms including shortness of breath, presyncope, nausea/vomiting, and concerns of an allergic reaction.  Patient states that she just started a new SSRI and has had reactions to medications like this in the past and feels that this is similar.  Patient denies any exacerbating or relieving factors but does state that symptoms have improved since onset approximately 2 hours prior to arrival.          Past Medical History:  Diagnosis Date   ADHD (attention deficit hyperactivity disorder)     Patient Active Problem List   Diagnosis Date Noted   Abnormal cervical Pap smear with positive HPV DNA test 09/10/2013   History of HPV infection 09/10/2013   ADHD (attention deficit hyperactivity disorder) 11/04/2011    Past Surgical History:  Procedure Laterality Date   NO PAST SURGERIES      Prior to Admission medications   Medication Sig Start Date End Date Taking? Authorizing Provider  ondansetron (ZOFRAN ODT) 4 MG disintegrating tablet Take 1 tablet (4 mg total) by mouth every 8 (eight) hours as needed for nausea or vomiting. 01/20/21  Yes Tyia Binford, Clent Jacks, MD  EPINEPHrine 0.3 mg/0.3 mL IJ SOAJ injection INJECT 0.3 MLS INTO THE MUSCLE ONCE AS NEEDED FOR UP TO 1 DOSE 02/18/20   [provider]  fluticasone (FLONASE) 50 MCG/ACT nasal spray SHAKE LQ AND U 2 SPRAYS IEN ONCE D 09/09/17   [provider]  levocetirizine (XYZAL) 5 MG tablet Take 5 mg by mouth every evening.    [provider]  levonorgestrel (MIRENA) 20 MCG/24HR IUD 1 each by Intrauterine route once. 07/21/20    [name], CNM  montelukast (SINGULAIR) 10 MG tablet  09/09/17   [provider]    Allergies Methylphenidate, Prozac [fluoxetine hcl], and Lisdexamfetamine dimesylate  Family History  Problem Relation Age of Onset   Cervical cancer Mother    Cervical cancer Maternal Aunt    Lung cancer Paternal Aunt    Lung cancer Paternal Grandmother    Throat cancer Paternal Aunt    Other Father        Cardiac Arrest   Other Paternal Grandfather        Cardiac Arrest    Social History Social History   Tobacco Use   Smoking status: Former   Smokeless tobacco: Never  Vaping Use   Vaping Use: Never used  Substance Use Topics   Alcohol use: No   Drug use: No    Review of Systems Constitutional: Endorses subjective fever/chills. Eyes: No visual changes. ENT: No sore throat. Cardiovascular: Denies chest pain. Respiratory: Endorses shortness of breath. Gastrointestinal: No abdominal pain.  Endorses nausea and vomiting.  No diarrhea. Genitourinary: Negative for dysuria. Musculoskeletal: Negative for acute arthralgias Skin: Endorses diaphoresis Neurological: Negative for headaches, weakness/numbness/paresthesias in any extremity Psychiatric: Negative for suicidal ideation/homicidal ideation   ____________________________________________   PHYSICAL EXAM:  VITAL SIGNS: ED Triage Vitals  Enc Vitals Group     BP 01/20/21 1944 120/85     Pulse Rate 01/20/21 1944 80     Resp 01/20/21 1944 18     Temp 01/20/21 1944  98.6 F (37 C)     Temp Source 01/20/21 1944 Oral     SpO2 01/20/21 1944 100 %     Weight --      Height --      Head Circumference --      Peak Flow --      Pain Score 01/20/21 1946 0     Pain Loc --      Pain Edu? --      Excl. in GC? --    Constitutional: Alert and oriented. Well appearing and in no acute distress. Eyes: Conjunctivae are normal. PERRL. Head: Atraumatic. Nose: No congestion/rhinnorhea. Mouth/Throat: Mucous membranes are  moist. Neck: No stridor Cardiovascular: Grossly normal heart sounds.  Good peripheral circulation. Respiratory: Normal respiratory effort.  No retractions. Gastrointestinal: Soft and nontender. No distention. Musculoskeletal: No obvious deformities Neurologic:  Normal speech and language. No gross focal neurologic deficits are appreciated. Skin:  Skin is warm and dry. No rash noted. Psychiatric: Mood and affect are normal. Speech and behavior are normal.  ____________________________________________   LABS (all labs ordered are listed, but only abnormal results are displayed)  Labs Reviewed  COMPREHENSIVE METABOLIC PANEL - Abnormal; Notable for the following components:      Result Value   Glucose, Bld 106 (*)    All other components within normal limits  CBC WITH DIFFERENTIAL/PLATELET - Abnormal; Notable for the following components:   WBC 12.1 (*)    Neutro Abs 10.3 (*)    All other components within normal limits  TROPONIN I (HIGH SENSITIVITY)  TROPONIN I (HIGH SENSITIVITY)   RADIOLOGY  ED MD interpretation: One-view portable chest x-ray shows no evidence of acute abnormalities including no pneumonia, pneumothorax, or widened mediastinum  Official radiology report(s): DG Chest Port 1 View  Result Date: 01/20/2021 CLINICAL DATA:  Shortness of breath EXAM: PORTABLE CHEST 1 VIEW COMPARISON:  None. FINDINGS: The heart size and mediastinal contours are within normal limits. Both lungs are clear. The visualized skeletal structures are unremarkable. IMPRESSION: No active disease. Electronically Signed   By: Elgie Collard M.D.   On: 01/20/2021 20:38    ____________________________________________   PROCEDURES  Procedure(s) performed (including Critical Care):  .1-3 Lead EKG Interpretation Performed by: Merwyn Katos, MD Authorized by: Merwyn Katos, MD     Interpretation: normal     ECG rate:  77   ECG rate assessment: normal     Rhythm: sinus rhythm     Ectopy:  none     Conduction: normal     ____________________________________________   INITIAL IMPRESSION / ASSESSMENT AND PLAN / ED COURSE  As part of my medical decision making, I reviewed the following data within the electronic medical record, if available:  Nursing notes reviewed and incorporated, Labs reviewed, EKG interpreted, Old chart reviewed, Radiograph reviewed and Notes from prior ED visits reviewed and incorporated      Patient presents with symptoms concerning for allergic reaction secondary to medication.  Given history and exam, presentation most consistent with allergic reaction. I have low suspicion for toxic shock syndrome, anaphylaxis, asthma exacerbation, or drug toxicity. Patient instructed to stop taking this offending medication and follow-up with her primary care provider for medication changes. Disposition: Discharge home with SRP. Follow up with PCP in 1-2 days.      ____________________________________________   FINAL CLINICAL IMPRESSION(S) / ED DIAGNOSES  Final diagnoses:  Allergic reaction, initial encounter  Shortness of breath     ED Discharge Orders  Ordered    ondansetron (ZOFRAN ODT) 4 MG disintegrating tablet  Every 8 hours PRN        01/20/21 2212             Note:  This document was prepared using Dragon voice recognition software and may include unintentional dictation errors.    Merwyn Katos, MD 01/20/21 [phone]

## 2021-01-20 NOTE — ED Triage Notes (Addendum)
Pt states she has possible food allergies per her PCP and was put on anxiety meds for panic attacks that were linked to her allergies- per PCP. PT states she was taking prozac and effexor on and off but stopped due to adverse reactions. Pt has been taking lexapro for the last week and zyrtec and benadryl today. Pt c/o abdominal cramps, leg cramps, N/V, shakes, heaviness sensation, and shallow breathing. Pt is AOX4, NAD noted. Breathing is even and unlabored, lung sounds clear bilaterally. Pt denies CP. Pt states she called poison control today as well and was recommended to the ED.

## 2021-02-22 ENCOUNTER — Other Ambulatory Visit: Payer: Self-pay | Admitting: Family Medicine

## 2021-02-22 DIAGNOSIS — R1011 Right upper quadrant pain: Secondary | ICD-10-CM

## 2021-02-22 DIAGNOSIS — R110 Nausea: Secondary | ICD-10-CM

## 2021-03-01 ENCOUNTER — Ambulatory Visit: Payer: BC Managed Care – PPO | Attending: Family Medicine

## 2021-03-02 NOTE — Telephone Encounter (Signed)
Mirena rcvd/charged 07/21/20

## 2021-04-23 ENCOUNTER — Ambulatory Visit (INDEPENDENT_AMBULATORY_CARE_PROVIDER_SITE_OTHER): Payer: BC Managed Care – PPO | Admitting: Allergy

## 2021-04-23 ENCOUNTER — Encounter: Payer: Self-pay | Admitting: Allergy

## 2021-04-23 ENCOUNTER — Other Ambulatory Visit: Payer: Self-pay

## 2021-04-23 VITALS — BP 90/60 | HR 102 | Temp 98.2°F | Resp 16 | Ht 66.73 in | Wt 165.2 lb

## 2021-04-23 DIAGNOSIS — T781XXD Other adverse food reactions, not elsewhere classified, subsequent encounter: Secondary | ICD-10-CM

## 2021-04-23 DIAGNOSIS — J3089 Other allergic rhinitis: Secondary | ICD-10-CM

## 2021-04-23 DIAGNOSIS — T7840XD Allergy, unspecified, subsequent encounter: Secondary | ICD-10-CM

## 2021-04-23 DIAGNOSIS — L509 Urticaria, unspecified: Secondary | ICD-10-CM | POA: Diagnosis not present

## 2021-04-23 DIAGNOSIS — T783XXD Angioneurotic edema, subsequent encounter: Secondary | ICD-10-CM

## 2021-04-23 MED ORDER — FAMOTIDINE 20 MG PO TABS: 20.0000 mg | ORAL_TABLET | Freq: Two times a day (BID) | ORAL | 3 refills | Status: DC

## 2021-04-23 NOTE — Progress Notes (Signed)
New Patient Note  RE: Carrie Brown MRN: [id number] DOB: 1986/02/26 Date of Office Visit: 04/23/2021  Consult requested by: Gayland Curry, MD Primary care provider: Gayland Curry, MD  Chief Complaint: Allergic Rhinitis  (Random rash every couple of months starts at ears and head and then spreads. Sym: itchy, red, swelling, then EPI, some irritated bowls (butt itches after using the rest room) not yeast or STDS stated by previous doctor./They think it might be wheat but are unsure. Was seeing an allergist at [place].) and Allergy Testing  History of Present Illness: I had the pleasure of seeing Carrie Brown for initial evaluation at the [facility] [place] on 04/24/2021. She is a 35 y.o. female, who is self-referred here for the evaluation of food allergy and allergic reactions.  Patient follows with allergy at [facility] - last OV was in 2021. She also seen  allergy for environmental allergies.   Rash started about 4 years ago. This always starts around her hairline and then can travel to the rest of her body. Describes them as itchy, red, raised. Individual rashes lasts about less than 1 day. No ecchymosis upon resolution. Associated symptoms include: lip/throat swelling and feeling sick to her stomach with nausea/vomiting. She also noted hypotension with this.  Also complaining of perianal pruritus which resolved after limiting gluten intake.   Patient can recall these episodes to date:  The first time she was packing for the beach and has not eaten/drank anything that day yet.  She second and third time it happened after eating a sub. The fourth time it happened after eating a sub. The fifth time it happened after eating a pizza and was at the zoo.  The last episode she had pizza and took aleve for headache - this occurred in September.   Patient had a reaction in June where she had LOC and had low blood pressure requiring Epipen. Earlier that day she took  aleve, had cheese pizza and took a shower. She noted some hair pruritus and took 2 Benadryl's which did not stop.  She had vomiting and diarrhea with this.   Frequency of episodes: every 2-3 months. Suspected triggers are unknown. Denies any fevers, chills, changes in medications, foods, personal care products or recent infections. She has tried the following therapies: zyrtec with some benefit. Systemic steroids no. Currently on zyrtec 73m daily.   Patient states that there were episodes when she was on zyrtec but she just started taking zyrtec more consistently lately. There were also some episodes where she took NSAIDs and some episodes she also had alcohol.   Patient eliminated wheat from her diet around May/June 2022. She thought that gluten was her trigger and so she stopped taking zyrtec. However 4 days afterwards she started to break out.   Previous history of rash/hives: none. Patient is up to date with the following cancer screening tests: physical exam and pap smears.  Reviewed images on the phone - consistent with urticarial rash.   She does have access to epinephrine autoinjector.  Past work up includes: 2021 bloodwork was negative to poppy seed, sesame seed, sunflower seed, pistachio. Borderline positive wheat. HAE panel unremarkable. Patient had a cookie last night and not had any issues.  Dietary History: patient has been eating other foods including milk - feels bloated, eggs, peanut, treenuts, sesame, shellfish, fish, soy, limited wheat, meats, fruits and vegetables.  Trying to eat gluten free and limiting dairy intake currently.   01/20/2021 ER visit: "  Carrie Brown is a 35 y.o. female who presents for multiple symptoms including shortness of breath, presyncope, nausea/vomiting, and concerns of an allergic reaction.  Patient states that she just started a new SSRI and has had reactions to medications like this in the past and feels that this is similar.  Patient denies  any exacerbating or relieving factors but does state that symptoms have improved since onset approximately 2 hours prior to arrival."  01/19/2020 allergy visit: "Carrie Brown is a 35 y.o. female who returns in follow up of ongoing episodes of nausea, vomiting, abdominal pain and intermittent rashes.   -She reports ongoing episodes of GI upset (N/V/D) and pruritic rashes responsive to antihistamines.Carrie Brown concerta with ongoing symptoms. Removed IUD. She vomits every time she drinks alcohol (95% of the time). -Skin testing today negative to: [place], [place]ec[place], [place], black, [place], [place], [place], [place], [place] / Hazelnut, Pistachio nut, sesame seed. -Will check specific IgE to pumpkin seed, sesame seed, sunflower seed, pistachio nut, wheat. -Prior evaluation with essentially normal complement work up - C3, C4, C1 esterase functional, C1 esterase quantitative. Normal baseline tryptase. -GI work up with normal Celiac panel, ESR. She had a negative alpha gal IgE <0.10 kU/L. Her peripheral eosinophils weren't elevated (AEC 170). -Ongoing photodocumentation of episodes, use of antihistamines- cetirizine or levocetirizine daily"  03/11/2021 GI visit: "Carrie Brown is a 35 y.o. Who presents with new onset GI symptoms for the past 8-9 month which include chronic diarrhea 1-2 times per day and episodic vomiting, abdominal pain and hives. The vomiting and abdominal pain seems to be more correlated with the hives but doesn't always occur with hives. Differential includes bowel angioedema, celiac, IBD, alpha-gal, other food allergy.   - screening labs: complement, CBC, CMP, celiac panel, C1 esterase, fecal calprotectin, CRP, sed-rate - based on results may pursue and EGD/colon - keep a food diary especially with hives and vomiting episodes"  Assessment and Plan: Carrie Brown is a 35 y.o. female with: Allergic reaction Itchy rash for the past 4 years. Frequency every 2-3 months. No specific triggers  noted. Sometimes she has eaten gluten, taken NSAIDs or drank alcohol during these episodes. Usually resolves within 1 day. Few episodes with hypotension, angioedema requiring epinephrine and ER visit. Worked up by [facility] in 2020 and 2021 - unremarkable except mildly elevated wheat IgE. [facility] evaluation done for her GI issues as well.  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  I doubt it is gluten as she had gluten products throughout this time with no reactions.  Keep track of episodes, take pictures. Continue zyrtec (cetirizine) 36m once a day. If symptoms are not controlled or causes drowsiness let u[place]oreaknow. Start Pepcid (famotidine) 252mtwice a day.  Avoid the following potential triggers: alcohol, tight clothing, NSAIDs, hot showers and getting overheated. Get bloodwork to rule out other etiologies.  For mild symptoms you can take over the counter antihistamines such as Benadryl and monitor symptoms closely. If symptoms worsen or if you have severe symptoms including breathing issues, throat closure, significant swelling, whole body hives, severe diarrhea and vomiting, lightheadedness then inject epinephrine and seek immediate medical care afterwards. Action plan given. Limit gluten and dairy intake for now.  Possible Lactose intolerance May use lactose free milk or take a lactaid pill right before consuming anything with dairy.  Urticaria See assessment and plan as above.  Angio-edema See assessment and plan as above.  Other allergic rhinitis Rhino conjunctivitis symptoms in the spring. Takes Singulair daily and OTC antihistamines, Flonase with good benefit. Skin testing in May 2021 was positive to multiple items per patient report. No prior allergy injections.  Use over the counter antihistamines such as Zyrtec (cetirizine), Claritin (loratadine), Allegra (fexofenadine), or Xyzal  (levocetirizine) daily as needed. May take twice a day during allergy flares. May switch antihistamines every few months. May take Singulair 52m daily as needed. Use Flonase (fluticasone) nasal spray 1 spray per nostril twice a day as needed for nasal congestion.  Nasal saline spray (i.e., Simply Saline) or nasal saline lavage (i.e., NeilMed) is recommended as needed and prior to medicated nasal sprays.  Return in about 2 months (around 06/24/2021).  Meds ordered this encounter  Medications   famotidine (PEPCID) 20 MG tablet    Sig: Take 1 tablet (20 mg total) by mouth 2 (two) times daily.    Dispense:  60 tablet    Refill:  3    Lab Orders         Alpha-Gal Panel         ANA w/Reflex         C1 Esterase Inhibitor         C1 esterase inhibitor, functional         C3 and C4         CBC with Differential/Platelet         Comprehensive metabolic panel         C-reactive protein         Sedimentation rate         Thyroid Cascade Profile         Tryptase         Chronic Urticaria         IgE Milk w/ Component Reflex         Allergen Gluten f79         Wheat IgE      Other allergy screening: Asthma: no Rhino conjunctivitis:  Rhino conjunctivitis symptoms in the spring. Takes Singulair daily and OTC antihistamines, Flonase with good benefit. Skin testing in May 2021 was positive to multiple items per patient report. No prior allergy injections.   Medication allergy: yes Hymenoptera allergy: no Eczema:no History of recurrent infections suggestive of immunodeficency: no  Diagnostics: Skin Testing: Deferred due to recent antihistamines use.  Past Medical History: Patient Active Problem List   Diagnosis Date Noted   Allergic reaction 04/24/2021   Urticaria 04/24/2021   Other allergic rhinitis 04/24/2021   Other adverse food reactions, not elsewhere classified, subsequent encounter 04/24/2021   Angio-edema 04/24/2021   Abnormal cervical Pap smear with positive HPV DNA test  09/10/2013   History of HPV infection 09/10/2013   ADHD (attention deficit hyperactivity disorder) 11/04/2011   Past Medical History:  Diagnosis Date   ADHD (attention deficit hyperactivity disorder)    Past Surgical History: Past Surgical History:  Procedure Laterality Date   NO PAST SURGERIES     Medication List:  Current Outpatient Medications  Medication Sig Dispense Refill   cetirizine (ZYRTEC) 10 MG tablet 1 tablet     EPINEPHrine 0.3 mg/0.3 mL IJ SOAJ injection INJECT 0.3 MLS INTO THE MUSCLE ONCE AS NEEDED FOR UP TO 1 DOSE     escitalopram (LEXAPRO) 5 MG tablet TAKE 1 TABLET(5 MG) BY MOUTH EVERY DAY     famotidine (PEPCID) 20 MG tablet Take 1 tablet (20 mg total) by mouth 2 (two) times daily. 6Ryder  tablet 3   levonorgestrel (MIRENA) 20 MCG/24HR IUD 1 each by Intrauterine route once.     fluticasone (FLONASE) 50 MCG/ACT nasal spray SHAKE LQ AND U 2 SPRAYS IEN ONCE D (Patient not taking: Reported on 04/23/2021)  12   levocetirizine (XYZAL) 5 MG tablet Take 5 mg by mouth every evening. (Patient not taking: Reported on 04/23/2021)     montelukast (SINGULAIR) 10 MG tablet  (Patient not taking: Reported on 04/23/2021)  11   ondansetron (ZOFRAN ODT) 4 MG disintegrating tablet Take 1 tablet (4 mg total) by mouth every 8 (eight) hours as needed for nausea or vomiting. (Patient not taking: Reported on 04/23/2021) 20 tablet 0   No current facility-administered medications for this visit.   Allergies: Allergies  Allergen Reactions   Methylphenidate Rash and Nausea And Vomiting    And RITALIN- also causes vomiting   Other Anaphylaxis    Aleve, ibuprofen   Prozac [Fluoxetine Hcl] Hives   Lisdexamfetamine Dimesylate Anxiety    emotional   Social History: Social History   Socioeconomic History   Marital status: Married    Spouse name: Not on file   Number of children: Not on file   Years of education: Not on file   Highest education level: Not on file  Occupational History   Not  on file  Tobacco Use   Smoking status: Former   Smokeless tobacco: Never  Vaping Use   Vaping Use: Never used  Substance and Sexual Activity   Alcohol use: No   Drug use: No   Sexual activity: Yes    Comment: IUD removed 10/20  Other Topics Concern   Not on file  Social History Narrative   Not on file   Social Determinants of Health   Financial Resource Strain: Not on file  Food Insecurity: Not on file  Transportation Needs: Not on file  Physical Activity: Not on file  Stress: Not on file  Social Connections: Not on file   Lives in a [place]. Smoking: quit in 2015 Occupation: IT trainer  Environmental History: Water Damage/mildew in the house: no Charity fundraiser in the family room: yes Carpet in the bedroom: yes Heating: gas Cooling: central Pet: yes 2 dogs x  many years and 2 cats  x 3 weeks  Family History: Family History  Problem Relation Age of Onset   Cervical cancer Mother    Cervical cancer Maternal Aunt    Lung cancer Paternal Aunt    Lung cancer Paternal Grandmother    Throat cancer Paternal Aunt    Other Father        Cardiac Arrest   Other Paternal Grandfather        Cardiac Arrest   Problem                               Relation Asthma                                   no Eczema                                no Food allergy                          no Allergic rhino conjunctivitis  no Angioedema    no  Review of Systems  Constitutional:  Negative for appetite change, chills, fever and unexpected weight change.  HENT:  Negative for congestion and rhinorrhea.   Eyes:  Negative for itching.  Respiratory:  Negative for cough, chest tightness, shortness of breath and wheezing.   Cardiovascular:  Negative for chest pain.  Gastrointestinal:  Positive for abdominal pain, diarrhea and vomiting.  Genitourinary:  Negative for difficulty urinating.  Skin:  Positive for rash.  Neurological:  Negative for headaches.   Objective: BP 90/60   Pulse  (!) 102   Temp 98.2 F (36.8 C) (Temporal)   Resp 16   Ht 5' 6.73" (1.695 m)   Wt 165 lb 3.2 oz (74.9 kg)   SpO2 98%   BMI 26.08 kg/m  Body mass index is 26.08 kg/m. Physical Exam Vitals and nursing note reviewed.  Constitutional:      Appearance: Normal appearance. She is well-developed.  HENT:     Head: Normocephalic and atraumatic.     Right Ear: Tympanic membrane and external ear normal.     Left Ear: Tympanic membrane and external ear normal.     Nose: Nose normal.     Mouth/Throat:     Mouth: Mucous membranes are moist.     Pharynx: Oropharynx is clear.  Eyes:     Conjunctiva/sclera: Conjunctivae normal.  Cardiovascular:     Rate and Rhythm: Normal rate and regular rhythm.     Heart sounds: Normal heart sounds. No murmur heard.   No friction rub. No gallop.  Pulmonary:     Effort: Pulmonary effort is normal.     Breath sounds: Normal breath sounds. No wheezing, rhonchi or rales.  Musculoskeletal:     Cervical back: Neck supple.  Skin:    General: Skin is warm.     Findings: No rash.  Neurological:     Mental Status: She is alert and oriented to person, place, and time.  Psychiatric:        Behavior: Behavior normal.  The plan was reviewed with the patient/family, and all questions/concerned were addressed.  It was my pleasure to see Carrie Brown today and participate in her care. Please feel free to contact me with any questions or concerns.  Sincerely,  Rexene Alberts, DO Allergy & Immunology  [facility] office: [place] office: [phone]

## 2021-04-23 NOTE — Patient Instructions (Addendum)
Allergic reactions/hives/swelling: Keep track of episodes, take pictures.  Continue zyrtec (cetirizine) 10mg  once a day. If symptoms are not controlled or causes drowsiness let know. Start pepcid (famotidine) 20mg  twice a day.  Avoid the following potential triggers: alcohol, tight clothing, NSAIDs, hot showers and getting overheated. Get bloodwork:  We are ordering labs, so please allow 1-2 weeks for the results to come back. With the newly implemented Cures Act, the labs might be visible to you at the same time that they become visible to me. However, I will not address the results until all of the results are back, so please be patient.   For mild symptoms you can take over the counter antihistamines such as Benadryl and monitor symptoms closely. If symptoms worsen or if you have severe symptoms including breathing issues, throat closure, significant swelling, whole body hives, severe diarrhea and vomiting, lightheadedness then inject epinephrine and seek immediate medical care afterwards. Action plan given.   Foods: Limit gluten and dairy  intake for now.  Possible Lactose intolerance May use lactose free milk or take a lactaid pill right before consuming anything with dairy.  Environmental allergies Use over the counter antihistamines such as Zyrtec (cetirizine), Claritin (loratadine), Allegra (fexofenadine), or Xyzal (levocetirizine) daily as needed. May take twice a day during allergy flares. May switch antihistamines every few months. May take Singulair 10mg  daily as needed. Use Flonase (fluticasone) nasal spray 1 spray per nostril twice a day as needed for nasal congestion.  Nasal saline spray (i.e., Simply Saline) or nasal saline lavage (i.e., NeilMed) is recommended as needed and prior to medicated nasal sprays.  Follow up in 2 months or sooner if needed.

## 2021-04-24 DIAGNOSIS — J3089 Other allergic rhinitis: Secondary | ICD-10-CM | POA: Insufficient documentation

## 2021-04-24 DIAGNOSIS — L509 Urticaria, unspecified: Secondary | ICD-10-CM | POA: Insufficient documentation

## 2021-04-24 DIAGNOSIS — T7840XA Allergy, unspecified, initial encounter: Secondary | ICD-10-CM | POA: Insufficient documentation

## 2021-04-24 DIAGNOSIS — T781XXD Other adverse food reactions, not elsewhere classified, subsequent encounter: Secondary | ICD-10-CM | POA: Insufficient documentation

## 2021-04-24 DIAGNOSIS — T783XXA Angioneurotic edema, initial encounter: Secondary | ICD-10-CM | POA: Insufficient documentation

## 2021-04-24 NOTE — Assessment & Plan Note (Signed)
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 Takes Singulair daily and OTC antihistamines, Flonase with good benefit. Skin testing in May 2021 was positive to multiple items per patient report. No prior allergy injections.  . Use over the counter antihistamines such as Zyrtec (cetirizine), Claritin (loratadine), Allegra (fexofenadine), or Xyzal (levocetirizine) daily as needed. May take twice a day during allergy flares. May switch antihistamines every few months. . May take Singulair 10mg  daily as needed. . Use Flonase (fluticasone) nasal spray 1 spray per nostril twice a day as needed for nasal congestion.  . Nasal saline spray (i.e., Simply Saline) or nasal saline lavage (i.e., NeilMed) is recommended as needed and prior to medicated nasal sprays.

## 2021-04-24 NOTE — Assessment & Plan Note (Signed)
.   See assessment and plan as above. 

## 2021-04-24 NOTE — Assessment & Plan Note (Addendum)
Itchy rash for the past 4 years. Frequency every 2-3 months. No specific triggers noted. Sometimes she has eaten gluten, taken NSAIDs or drank alcohol during these episodes. Usually resolves within 1 day. Few episodes with hypotension, angioedema requiring epinephrine and ER visit. Worked up by [facility] in 2020 and 2021 - unremarkable except mildly elevated wheat IgE. [facility] evaluation done for her GI issues as well.   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   I doubt it is gluten as she had gluten products throughout this time with no reactions.   Keep track of episodes, take pictures.  Continue zyrtec (cetirizine) 10mg  once a day.  If symptoms are not controlled or causes drowsiness let know.  Start Pepcid (famotidine) 20mg  twice a day.  . Avoid the following potential triggers: alcohol, tight clothing, NSAIDs, hot showers and getting overheated. . Get bloodwork to rule out other etiologies.  . For mild symptoms you can take over the counter antihistamines such as Benadryl and monitor symptoms closely. If symptoms worsen or if you have severe symptoms including breathing issues, throat closure, significant swelling, whole body hives, severe diarrhea and vomiting, lightheadedness then inject epinephrine and seek immediate medical care afterwards. . Action plan given. . Limit gluten and dairy intake for now.  . Possible Lactose intolerance  May use lactose free milk or take a lactaid pill right before consuming anything with dairy.

## 2021-04-27 ENCOUNTER — Encounter: Payer: Self-pay | Admitting: Allergy

## 2021-05-02 LAB — COMPREHENSIVE METABOLIC PANEL
ALT: 12 IU/L (ref 0–32)
AST: 15 IU/L (ref 0–40)
Albumin/Globulin Ratio: 2.4 — ABNORMAL HIGH (ref 1.2–2.2)
Albumin: 5.2 g/dL — ABNORMAL HIGH (ref 3.8–4.8)
Alkaline Phosphatase: 65 IU/L (ref 44–121)
BUN/Creatinine Ratio: 9 (ref 9–23)
BUN: 8 mg/dL (ref 6–20)
Bilirubin Total: 0.3 mg/dL (ref 0.0–1.2)
CO2: 24 mmol/L (ref 20–29)
Calcium: 10 mg/dL (ref 8.7–10.2)
Chloride: 99 mmol/L (ref 96–106)
Creatinine, Ser: 0.88 mg/dL (ref 0.57–1.00)
Globulin, Total: 2.2 g/dL (ref 1.5–4.5)
Glucose: 83 mg/dL (ref 70–99)
Potassium: 4.5 mmol/L (ref 3.5–5.2)
Sodium: 138 mmol/L (ref 134–144)
Total Protein: 7.4 g/dL (ref 6.0–8.5)
eGFR: 87 mL/min/{1.73_m2} (ref >59)

## 2021-05-02 LAB — C3 AND C4
Complement C3, Serum: 117 mg/dL (ref 82–167)
Complement C4, Serum: 16 mg/dL (ref 12–38)

## 2021-05-02 LAB — CBC WITH DIFFERENTIAL/PLATELET
Basophils Absolute: 0.1 10*3/uL (ref 0.0–0.2)
Basos: 1 %
EOS (ABSOLUTE): 0.2 10*3/uL (ref 0.0–0.4)
Eos: 3 %
Hematocrit: 43.1 % (ref 34.0–46.6)
Hemoglobin: 14.5 g/dL (ref 11.1–15.9)
Immature Grans (Abs): 0 10*3/uL (ref 0.0–0.1)
Immature Granulocytes: 0 %
Lymphocytes Absolute: 1.4 10*3/uL (ref 0.7–3.1)
Lymphs: 23 %
MCH: 32.8 pg (ref 26.6–33.0)
MCHC: 33.6 g/dL (ref 31.5–35.7)
MCV: 98 fL — ABNORMAL HIGH (ref 79–97)
Monocytes Absolute: 0.4 10*3/uL (ref 0.1–0.9)
Monocytes: 6 %
Neutrophils Absolute: 4 10*3/uL (ref 1.4–7.0)
Neutrophils: 67 %
Platelets: 286 10*3/uL (ref 150–450)
RBC: 4.42 x10E6/uL (ref 3.77–5.28)
RDW: 11.7 % (ref 11.7–15.4)
WBC: 6 10*3/uL (ref 3.4–10.8)

## 2021-05-02 LAB — TRYPTASE: Tryptase: 3.3 ug/L (ref 2.2–13.2)

## 2021-05-02 LAB — ALPHA-GAL PANEL
Allergen Lamb IgE: <0.1 kU/L
Beef IgE: <0.1 kU/L
IgE (Immunoglobulin E), Serum: 215 IU/mL (ref 6–495)
O215-IgE Alpha-Gal: <0.1 kU/L
Pork IgE: <0.1 kU/L

## 2021-05-02 LAB — CHRONIC URTICARIA: cu index: 7.9 (ref <10)

## 2021-05-02 LAB — THYROID CASCADE PROFILE: TSH: 1 u[IU]/mL (ref 0.450–4.500)

## 2021-05-02 LAB — IGE MILK W/ COMPONENT REFLEX: F002-IgE Milk: <0.1 kU/L

## 2021-05-02 LAB — ALLERGEN, WHEAT, F4: Wheat IgE: 0.5 kU/L — AB

## 2021-05-02 LAB — C1 ESTERASE INHIBITOR, FUNCTIONAL: C1INH Functional/C1INH Total MFr SerPl: 80 %mean normal

## 2021-05-02 LAB — C-REACTIVE PROTEIN: CRP: <1 mg/L (ref 0–10)

## 2021-05-02 LAB — SEDIMENTATION RATE: Sed Rate: 2 mm/hr (ref 0–32)

## 2021-05-02 LAB — C1 ESTERASE INHIBITOR: C1INH SerPl-mCnc: 22 mg/dL (ref 21–39)

## 2021-05-02 LAB — ANA W/REFLEX: Anti Nuclear Antibody (ANA): NEGATIVE

## 2021-05-02 LAB — ALLERGEN GLUTEN F79: Allergen Gluten IgE: 2.25 kU/L — AB

## 2021-07-27 ENCOUNTER — Encounter: Payer: Self-pay | Admitting: Allergy

## 2021-07-30 ENCOUNTER — Ambulatory Visit: Payer: BC Managed Care – PPO | Admitting: Allergy

## 2023-05-29 IMAGING — DX DG CHEST 1V PORT
1 series · 1 of 1 positions shown · non-contrast
Comparison: None.

CLINICAL DATA: Shortness of breath

EXAM:
PORTABLE CHEST 1 VIEW

[chest ap]
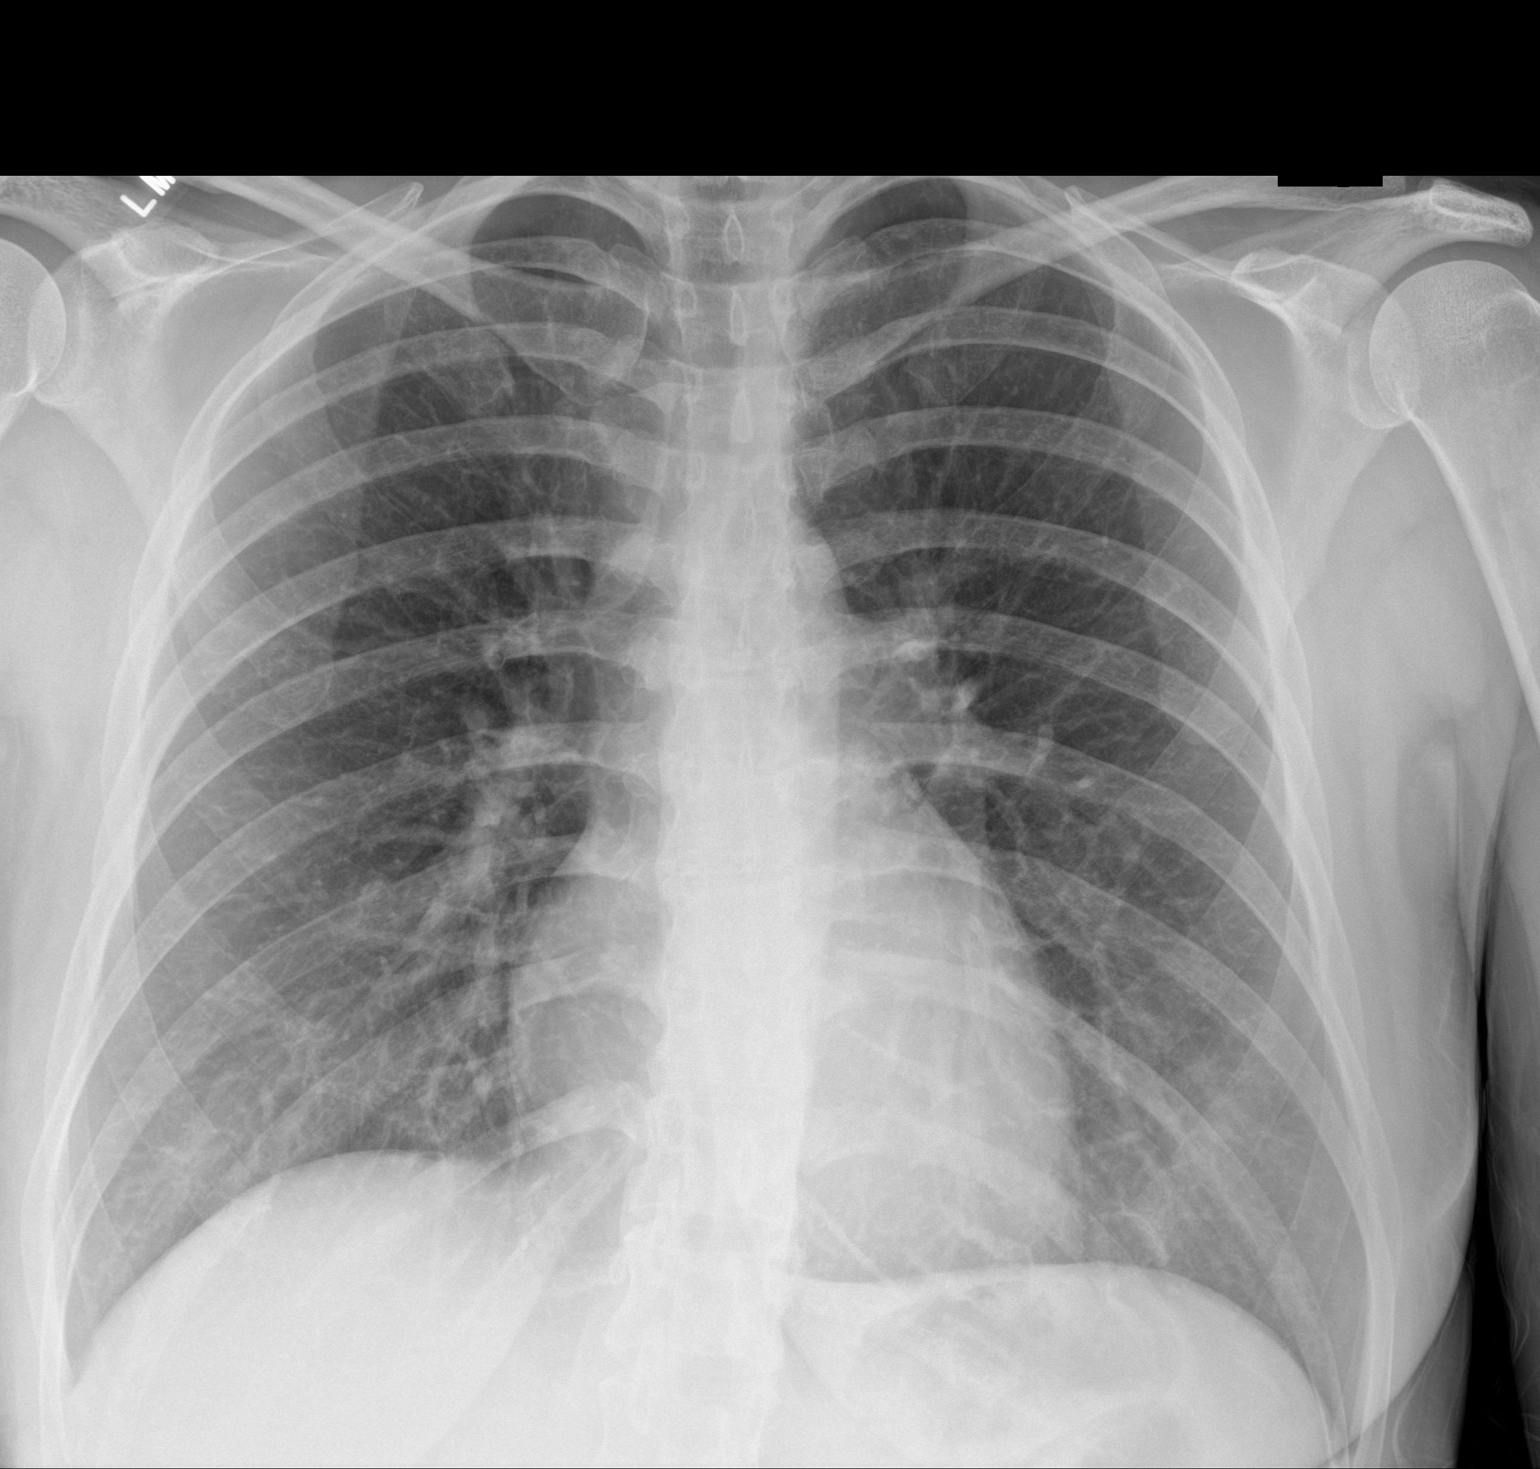

[1 of 1 positions shown; findings below may reference images not displayed]

FINDINGS: The heart size and mediastinal contours are within normal limits.
Both lungs are clear. The visualized skeletal structures are
unremarkable.
IMPRESSION: No active disease.

## 2023-07-20 ENCOUNTER — Other Ambulatory Visit: Payer: Self-pay

## 2023-07-20 ENCOUNTER — Inpatient Hospital Stay
Admission: RE | Admit: 2023-07-20 | Discharge: 2023-07-20 | Disposition: A | Discharge status: Home / Self Care | Payer: Self-pay | Source: Ambulatory Visit | Attending: Family Medicine | Admitting: Family Medicine

## 2023-07-20 ENCOUNTER — Emergency Department

## 2023-07-20 ENCOUNTER — Emergency Department
Admission: EM | Admit: 2023-07-20 | Discharge: 2023-07-20 | Disposition: A | Discharge status: Home / Self Care | Attending: Emergency Medicine | Admitting: Emergency Medicine

## 2023-07-20 DIAGNOSIS — M79602 Pain in left arm: Secondary | ICD-10-CM | POA: Insufficient documentation

## 2023-07-20 NOTE — ED Provider Notes (Signed)
 [facility] Provider Note    Event Date/Time   First MD Initiated Contact with Patient 07/20/23 1134     (approximate)   History   Possible blood clot    HPI  Carrie Brown is a 38 y.o. female with PMH of ADHD who presents for evaluation of possible blood clot to the left AC.  Patient had surgery 2 weeks ago and had an IV placed there.  She reports pain and swelling localized to the left AC.      Physical Exam   Triage Vital Signs: ED Triage Vitals  Encounter Vitals Group     BP 07/20/23 1127 109/61     Systolic BP Percentile --      Diastolic BP Percentile --      Pulse Rate 07/20/23 1127 84     Resp 07/20/23 1127 20     Temp 07/20/23 1127 98.1 F (36.7 C)     Temp Source 07/20/23 1127 Oral     SpO2 07/20/23 1127 98 %     Weight 07/20/23 1131 165 lb 2 oz (74.9 kg)     Height 07/20/23 1131 5' 6.75" (1.695 m)     Head Circumference --      Peak Flow --      Pain Score 07/20/23 1129 4     Pain Loc --      Pain Education --      Exclude from Growth Chart --     Most recent vital signs: Vitals:   07/20/23 1127  BP: 109/61  Pulse: 84  Resp: 20  Temp: 98.1 F (36.7 C)  SpO2: 98%   General: Awake, no distress.  CV:  Good peripheral perfusion.  Resp:  Normal effort.  Abd:  No distention.  Other:  Left arm not swollen when compared with the right, no overlying skin changes, redness or bruising, skin is warm and dry, no palpable nodule felt in the [facility], radial pulse is 2+ and regular.    ED Results / Procedures / Treatments   Labs (all labs ordered are listed, but only abnormal results are displayed) Labs Reviewed - No data to display   RADIOLOGY  Left upper extremity ultrasound obtained, I interpreted the images as well as reviewed the radiologist report which was negative for DVT.   PROCEDURES:  Critical Care performed: No  Procedures   MEDICATIONS ORDERED IN ED: Medications - No data to display   IMPRESSION / MDM /  ASSESSMENT AND PLAN / ED COURSE  I reviewed the triage vital signs and the nursing notes.                             38 year old female presents for evaluation of possible blood clot to the left AC.  Vital signs are stable patient NAD on exam.  Differential diagnosis includes, but is not limited to, DVT, cellulitis, allergic reaction, thrombophlebitis, muscle strain.  Patient's presentation is most consistent with acute complicated illness / injury requiring diagnostic workup.  Ultrasound negative for DVT.  No overlying skin changes concerning for cellulitis or allergic reaction.  Patient given reassurance.  She can take Tylenol or ibuprofen as needed for pain.  She voiced understanding, all questions were answered and she was stable at discharge.      FINAL CLINICAL IMPRESSION(S) / ED DIAGNOSES   Final diagnoses:  Left arm pain     Rx / DC Orders   ED  Discharge Orders     None        Note:  This document was prepared using Dragon voice recognition software and may include unintentional dictation errors.   Cameron Ali, PA-C 07/20/23 1356    Trinna Post, MD 07/20/23 1810

## 2023-07-20 NOTE — ED Triage Notes (Addendum)
 Pt to ED via POV from home. Pt reports sent for possible blood clot in left AC area. Pt reports swelling and pain since Thursday. Pt reports IV for surgery was in left AC. Pt reports post op from [facility] 2 wks ago. Pt denies SOB or CP. Not on blood thinners.   Surgery was done at [facility] in [facility]

## 2023-07-20 NOTE — Discharge Instructions (Signed)
 The ultrasound was negative for a DVT. You can take 650 mg of Tylenol and 600 mg of ibuprofen every 6 hours as needed for pain.  Return to the ED with any worsening symptoms.

## 2023-07-20 NOTE — ED Notes (Signed)
 See triage note  Presents with some tenderness and min swelling to left AC area  States she had an IV placed last week  Unsure this is coming from  No redness noted

## 2023-10-01 ENCOUNTER — Ambulatory Visit
Admission: RE | Admit: 2023-10-01 | Discharge: 2023-10-01 | Disposition: A | Discharge status: Home / Self Care | Source: Ambulatory Visit

## 2023-10-01 VITALS — BP 99/67 | HR 65 | Temp 98.8°F | Resp 16 | Ht 66.0 in | Wt 160.0 lb

## 2023-10-01 DIAGNOSIS — H6992 Unspecified Eustachian tube disorder, left ear: Secondary | ICD-10-CM | POA: Diagnosis not present

## 2023-10-01 MED ORDER — IPRATROPIUM BROMIDE 0.06 % NA SOLN: 2.0000 | Freq: Four times a day (QID) | NASAL | 12 refills | Status: AC

## 2023-10-01 MED ORDER — FLUTICASONE PROPIONATE 50 MCG/ACT NA SUSP: 2.0000 | Freq: Every day | NASAL | 1 refills | Status: AC

## 2023-10-01 MED ORDER — FEXOFENADINE HCL 180 MG PO TABS: 180.0000 mg | ORAL_TABLET | Freq: Every day | ORAL | 1 refills | Status: AC

## 2023-10-01 NOTE — Discharge Instructions (Addendum)
 Use the Atrovent nasal spray, 2 squirts in each nostril every 6 hours, to help with nasal congestion.  Take Allegra 180 mg once daily to help with your allergy symptoms..  Instill 2 squirts of fluticasone in each nostril at bedtime nightly.  And the nasal away from the septum of your nose and follow each set of squirts with 1 squirt of nasal saline to push the particles up into your turbinates where they will take effect.  Continue to equalize your ears as shown to help clear mucus from eustachian tubes and maintain patency.  If you are unable to clear the eustachian tube, you develop pain in your left ear, drainage from left ear, or fever please return for reevaluation or follow-up with your primary care provider.

## 2023-10-01 NOTE — ED Triage Notes (Signed)
 Pt c/o L ear fulness x4 days. States can not hear out of my right ear after head congestion this weekend related to allergies - Entered by patient

## 2023-10-01 NOTE — ED Provider Notes (Signed)
 [facility]    CSN: [id number] Arrival date & time: 10/01/23  1211      History   Chief Complaint Chief Complaint  Patient presents with   Ear Fullness    Appt    HPI Carrie Brown is a 38 y.o. female.   HPI  38 year old female with past medical history significant for ADHD and seasonal allergies presents for evaluation of left ear fullness and ringing that has been going on for the last 4 days.  She reports that she was working out in the yard on Saturday and had a lot of sneezing and runny nose.  Sunday she had more sneezing and that is when she developed the fullness in her ear as well as the ringing.  She reports that she cannot hear out of her left ear at all at this point.  She denies any pain or drainage.  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 Authorizing Provider  EPINEPHrine  0.3 mg/0.3 mL IJ SOAJ injection INJECT 0.3 MLS INTO THE MUSCLE ONCE AS NEEDED FOR UP TO 1 DOSE 02/18/20  Yes [provider]  fexofenadine (ALLEGRA) 180 MG tablet Take 1 tablet (180 mg total) by mouth daily. 10/01/23  Yes Kent Pear, NP   fluticasone (FLONASE) 50 MCG/ACT nasal spray Place 2 sprays into both nostrils daily. 10/01/23  Yes Kent Pear, NP  ipratropium (ATROVENT) 0.06 % nasal spray Place 2 sprays into both nostrils 4 (four) times daily. 10/01/23  Yes Kent Pear, NP  levonorgestrel  (MIRENA ) 20 MCG/24HR IUD 1 each by Intrauterine route once. 07/21/20  Yes Angelita Kendall, CNM  busPIRone (BUSPAR) 5 MG tablet Take 5 mg by mouth 3 (three) times daily.    [provider]  montelukast (SINGULAIR) 10 MG tablet  09/09/17   [provider]  ondansetron  (ZOFRAN  ODT) 4 MG disintegrating tablet Take 1 tablet (4 mg total) by mouth every 8 (eight) hours as needed for nausea or vomiting. Patient not taking: Reported on 04/23/2021 01/20/21   Charleen Conn, MD    Family History Family History  Problem Relation Age of Onset   Cervical cancer Mother    Cervical cancer Maternal Aunt    Lung cancer Paternal Aunt    Lung cancer Paternal Grandmother    Throat cancer Paternal Aunt    Other Father        Cardiac Arrest   Other  Paternal Grandfather        Cardiac Arrest    Social History Social History   Tobacco Use   Smoking status: Former   Smokeless tobacco: Never  Advertising account planner   Vaping status: Never Used  Substance Use Topics   Alcohol use: No   Drug use: No     Allergies   Methylphenidate, Other, Venlafaxine, Prozac [fluoxetine hcl], and Lisdexamfetamine dimesylate   Review of Systems Review of Systems  HENT:  Positive for ear pain, hearing loss and tinnitus. Negative for ear discharge.      Physical Exam Triage Vital Signs ED Triage Vitals [10/01/23 1229]  Encounter Vitals Group     BP      Systolic BP Percentile      Diastolic BP Percentile      Pulse      Resp 16     Temp      Temp Source Oral     SpO2      Weight      Height      Head Circumference      Peak Flow      Pain Score      Pain Loc      Pain Education      Exclude from Growth Chart    No data found.  Updated  Vital Signs BP 99/67 (BP Location: Left Arm)   Pulse 65   Temp 98.8 F (37.1 C) (Oral)   Resp 16   Ht 5\' 6"  (1.676 m)   Wt 160 lb (72.6 kg)   SpO2 100%   BMI 25.82 kg/m   Visual Acuity Right Eye Distance:   Left Eye Distance:   Bilateral Distance:    Right Eye Near:   Left Eye Near:    Bilateral Near:     Physical Exam Vitals and nursing note reviewed.  Constitutional:      Appearance: Normal appearance. She is not ill-appearing.  HENT:     Head: Normocephalic and atraumatic.     Right Ear: Tympanic membrane, ear canal and external ear normal. There is no impacted cerumen.     Left Ear: Tympanic membrane, ear canal and external ear normal. There is no impacted cerumen.     Ears:     Comments: No tenderness with external palpation of bilateral eustachian tubes. Skin:    General: Skin is warm and dry.     Capillary Refill: Capillary refill takes less than 2 seconds.     Findings: No rash.  Neurological:     General: No focal deficit present.     Mental Status: She is alert and oriented to person, place, and time.      UC Treatments / Results  Labs (all labs ordered are listed, but only abnormal results are displayed) Labs Reviewed - No data to display  EKG   Radiology No results found.  Procedures Procedures (including critical care time)  Medications Ordered in UC Medications - No data to display  Initial Impression / Assessment and Plan / UC Course  I have reviewed the triage vital signs and the nursing notes.  Pertinent labs & imaging results that were available during my care of the patient were reviewed by me and considered in my medical decision making (see chart for details).   Patient is a pleasant, nontoxic-appearing 38 year old female presenting for evaluation of left ear fullness, tinnitus, and decreased hearing as outlined HPI above.  Her physical exam reveals pearly gray tympanic membranes bilaterally and  both external auditory canals are  clear.  She has no tenderness with external palpation of bilateral eustachian tubes.  I did have patient perform the Valsalva maneuver in the exam room but she was unable to clear her left ear.  I suspect patient is experiencing eustachian tube dysfunction.  I will discharge her home with a prescription for Flonase nasal spray, Atrovent nasal spray, and have her start taking Allegra 180 mg once daily to help with allergy symptoms.  I will also have her frequently attempt to clear her left eustachian tube using the Valsalva maneuver.  If her symptoms do not improve, or they worsen, she should return for reevaluation or see her primary care provider.   Final Clinical Impressions(s) / UC Diagnoses   Final diagnoses:  Acute dysfunction of left eustachian tube     Discharge Instructions      Use the Atrovent nasal spray, 2 squirts in each nostril every 6 hours, to help with nasal congestion.  Take Allegra 180 mg once daily to help with your allergy symptoms..  Instill 2 squirts of fluticasone in each nostril at bedtime nightly.  And the nasal away from the septum of your nose and follow each set of squirts with 1 squirt of nasal saline to push the particles up into your turbinates where they will take effect.  Continue to equalize your ears as shown to help clear mucus from eustachian tubes and maintain patency.  If you are unable to clear the eustachian tube, you develop pain in your left ear, drainage from left ear, or fever please return for reevaluation or follow-up with your primary care provider.     ED Prescriptions     Medication Sig Dispense Auth. Provider   ipratropium (ATROVENT) 0.06 % nasal spray Place 2 sprays into both nostrils 4 (four) times daily. 15 mL Kent Pear, NP   fluticasone [facility]) 50 MCG/ACT nasal spray Place 2 sprays into both nostrils daily. 18.2 mL Kent Pear, NP   fexofenadine (ALLEGRA) 180 MG tablet Take 1 tablet (180 mg total) by mouth daily. 30 tablet  Kent Pear, NP      PDMP not reviewed this encounter.   Kent Pear, NP 10/01/23 1248

## 2024-06-17 ENCOUNTER — Inpatient Hospital Stay: Admission: RE | Admit: 2024-06-17 | Discharge: 2024-06-17 | Payer: Self-pay

## 2024-06-17 VITALS — BP 111/73 | HR 82 | Temp 98.7°F | Resp 18

## 2024-06-17 DIAGNOSIS — R051 Acute cough: Secondary | ICD-10-CM

## 2024-06-17 DIAGNOSIS — J22 Unspecified acute lower respiratory infection: Secondary | ICD-10-CM

## 2024-06-17 MED ORDER — DOXYCYCLINE HYCLATE 100 MG PO CAPS: 100.0000 mg | ORAL_CAPSULE | Freq: Two times a day (BID) | ORAL | 0 refills | Status: AC

## 2024-06-17 MED ORDER — PROMETHAZINE-DM 6.25-15 MG/5ML PO SYRP: 5.0000 mL | ORAL_SOLUTION | Freq: Four times a day (QID) | ORAL | 0 refills | Status: AC | PRN

## 2024-06-17 NOTE — ED Triage Notes (Signed)
 Cough ongoing since this past Monday. States had sick symptoms starting last week but only the cough remains for the last week. States the cough is dry.   Patient tried Delsym DM, Dayquil, Nyquil, elderberry, Sudafed PE with no relief.

## 2024-06-17 NOTE — ED Provider Notes (Signed)
 " [facility]    CSN: [id number] Arrival date & time: 06/17/24  1253      History   Chief Complaint Chief Complaint  Patient presents with   Cough    Entered by patient    HPI Carrie Brown is a 39 y.o. female.   39 year old female, Carrie Brown, presents to urgent care for evaluation of cough x 10 days. Works at [employer], unknown illness exposure. Pt has tried delsym DM, Nyquil, sudafed PE and elderberry without relief.   The history is provided by the patient. No language interpreter was used.    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 Authorizing Provider  busPIRone (BUSPAR) 5 MG tablet Take 5 mg by mouth 3 (three) times daily.   Yes [provider]  doxycycline  (VIBRAMYCIN ) 100 MG capsule Take 1 capsule (100 mg total) by mouth 2 (two) times daily for 7 days. 06/17/24 06/24/24 Yes Alton Tremblay, Rilla, NP  levonorgestrel  (MIRENA ) 20 MCG/24HR IUD 1 each by Intrauterine route once. 07/21/20  Yes Lynda Bradley, CNM  progesterone (PROMETRIUM) 100 MG capsule Take  100-200 mg by mouth at bedtime.   Yes [provider]  promethazine -dextromethorphan (PROMETHAZINE -DM) 6.25-15 MG/5ML syrup Take 5 mLs by mouth 4 (four) times daily as needed for cough. 06/17/24  Yes Bryden Darden, Rilla, NP  EPINEPHrine  0.3 mg/0.3 mL IJ SOAJ injection INJECT 0.3 MLS INTO THE MUSCLE ONCE AS NEEDED FOR UP TO 1 DOSE 02/18/20   [provider]  fexofenadine  (ALLEGRA ) 180 MG tablet Take 1 tablet (180 mg total) by mouth daily. 10/01/23   Bernardino Ditch, NP  fluticasone  (FLONASE ) 50 MCG/ACT nasal spray Place 2 sprays into both nostrils daily. 10/01/23   Bernardino Ditch, NP  ipratropium (ATROVENT ) 0.06 % nasal spray Place 2 sprays into both nostrils 4 (four) times daily. 10/01/23   Bernardino Ditch, NP  montelukast (SINGULAIR) 10 MG tablet  09/09/17   [provider]  ondansetron  (ZOFRAN  ODT) 4 MG disintegrating tablet Take 1 tablet (4 mg total) by mouth every 8 (eight) hours as needed for nausea or vomiting. Patient not taking: Reported on 04/23/2021 01/20/21   Jossie Artist POUR, MD    Family History Family History  Problem Relation Age of Onset   Cervical cancer Mother    Cervical cancer Maternal Aunt    Lung cancer  Paternal Aunt    Lung cancer Paternal Grandmother    Throat cancer Paternal Aunt    Other Father        Cardiac Arrest   Other Paternal Grandfather        Cardiac Arrest    Social History Social History[1]   Allergies   Methylphenidate, Other, Venlafaxine, Prozac [fluoxetine hcl], and Lisdexamfetamine dimesylate   Review of Systems Review of Systems  Constitutional:  Negative for fever.  HENT:  Positive for congestion and sinus pressure.   Respiratory:  Positive for cough. Negative for shortness of breath and wheezing.   All other systems reviewed and are negative.    Physical Exam Triage Vital Signs ED Triage Vitals  Encounter Vitals Group     BP      Girls Systolic BP Percentile      Girls Diastolic BP Percentile      Boys Systolic BP  Percentile      Boys Diastolic BP Percentile      Pulse      Resp      Temp      Temp src      SpO2      Weight      Height      Head Circumference      Peak Flow      Pain Score      Pain Loc      Pain Education      Exclude from Growth Chart    No data found.  Updated Vital Signs BP 111/73 (BP Location: Right Arm)   Pulse 82   Temp 98.7 F (37.1 C) (Oral)   Resp 18   LMP  (LMP Unknown)   SpO2 96%   Visual Acuity Right Eye Distance:   Left Eye Distance:   Bilateral Distance:    Right Eye Near:   Left Eye Near:    Bilateral Near:     Physical Exam Vitals and nursing note reviewed.  Constitutional:      General: She is not in acute distress.    Appearance: She is well-developed.  HENT:     Head: Normocephalic.     Right Ear: Tympanic membrane is retracted.     Left Ear: Tympanic membrane is retracted.     Nose: Mucosal edema and congestion present.     Right Sinus: Maxillary sinus tenderness present.     Left Sinus: Maxillary sinus tenderness present.     Mouth/Throat:     Lips: Pink.     Mouth: Mucous membranes are moist.     Pharynx: Oropharynx is clear.  Eyes:     General: Lids are normal.     Conjunctiva/sclera: Conjunctivae normal.     Pupils: Pupils are equal, round, and reactive to light.  Neck:     Trachea: No tracheal deviation.  Cardiovascular:     Rate and Rhythm: Normal rate and regular rhythm.     Heart sounds: Normal heart sounds. No murmur heard. Pulmonary:     Effort: Pulmonary effort is normal.     Breath sounds: Normal breath sounds and air entry.  Abdominal:     General: Bowel sounds are normal.     Palpations: Abdomen is soft.     Tenderness: There is no abdominal tenderness.  Musculoskeletal:        General: Normal range of motion.     Cervical back: Normal range of motion.  Lymphadenopathy:     Cervical: No cervical adenopathy.  Skin:  General: Skin is warm and dry.     Findings: No rash.  Neurological:     General:  No focal deficit present.     Mental Status: She is alert and oriented to person, place, and time.     GCS: GCS eye subscore is 4. GCS verbal subscore is 5. GCS motor subscore is 6.  Psychiatric:        Speech: Speech normal.        Behavior: Behavior normal. Behavior is cooperative.      UC Treatments / Results  Labs (all labs ordered are listed, but only abnormal results are displayed) Labs Reviewed - No data to display  EKG   Radiology No results found.  Procedures Procedures (including critical care time)  Medications Ordered in UC Medications - No data to display  Initial Impression / Assessment and Plan / UC Course  I have reviewed the triage vital signs and the nursing notes.  Pertinent labs & imaging results that were available during my care of the patient were reviewed by me and considered in my medical decision making (see chart for details).     Discussed exam findings plan of care with patient :we are treating you for a acute respiratory infection Drink plenty of water Take doxycyline and phenergan  Dm as prescribed.  Follow up with PCP If you develop chest pain, shortness of breath, or palpitations go to Er for further evaluation.  Patient verbalized understanding to this provider  Ddx: Acute respiratory infection, acute cough, viral illness, allergies Final Clinical Impressions(s) / UC Diagnoses   Final diagnoses:  Acute respiratory infection  Acute cough     Discharge Instructions      We are treating you for a acute respiratory infection Drink plenty of water Take doxycyline and phenergan  Dm as prescribed.  Follow up with PCP If you develop chest pain, shortness of breath, or palpitations go to Er for further evaluation.      ED Prescriptions     Medication Sig Dispense Auth. Provider   promethazine -dextromethorphan (PROMETHAZINE -DM) 6.25-15 MG/5ML syrup Take 5 mLs by mouth 4 (four) times daily as needed for cough. 118 mL Eusebia Grulke,  Cammi Consalvo, NP   doxycycline  (VIBRAMYCIN ) 100 MG capsule Take 1 capsule (100 mg total) by mouth 2 (two) times daily for 7 days. 14 capsule Jetaime Pinnix, NP      PDMP not reviewed this encounter.     [1]  Social History Tobacco Use   Smoking status: Former   Smokeless tobacco: Never  Vaping Use   Vaping status: Never Used  Substance Use Topics   Alcohol use: No   Drug use: No     Lashayla Armes, Rilla, NP 06/17/24 1809  "

## 2024-06-17 NOTE — Discharge Instructions (Addendum)
 We are treating you for a acute respiratory infection Drink plenty of water Take doxycyline and phenergan  Dm as prescribed.  Follow up with PCP If you develop chest pain, shortness of breath, or palpitations go to Er for further evaluation.
